# Patient Record
Sex: Female | Born: 1945 | Race: White | Hispanic: No | Smoking: Never smoker
Health system: Southern US, Community
[De-identification: ages and names within clinical notes are randomized; demographics above are authoritative.]

## PROBLEM LIST (undated history)

## (undated) ENCOUNTER — Ambulatory Visit (HOSPITAL_COMMUNITY): Admission: EM | Payer: Medicare Other

## (undated) DIAGNOSIS — I639 Cerebral infarction, unspecified: Secondary | ICD-10-CM

## (undated) DIAGNOSIS — R413 Other amnesia: Secondary | ICD-10-CM

## (undated) DIAGNOSIS — E039 Hypothyroidism, unspecified: Secondary | ICD-10-CM

## (undated) DIAGNOSIS — F32A Depression, unspecified: Secondary | ICD-10-CM

## (undated) DIAGNOSIS — S5290XA Unspecified fracture of unspecified forearm, initial encounter for closed fracture: Secondary | ICD-10-CM

## (undated) DIAGNOSIS — I6930 Unspecified sequelae of cerebral infarction: Secondary | ICD-10-CM

## (undated) DIAGNOSIS — I669 Occlusion and stenosis of unspecified cerebral artery: Secondary | ICD-10-CM

## (undated) DIAGNOSIS — E119 Type 2 diabetes mellitus without complications: Secondary | ICD-10-CM

## (undated) DIAGNOSIS — R269 Unspecified abnormalities of gait and mobility: Secondary | ICD-10-CM

## (undated) DIAGNOSIS — E785 Hyperlipidemia, unspecified: Secondary | ICD-10-CM

## (undated) DIAGNOSIS — F329 Major depressive disorder, single episode, unspecified: Secondary | ICD-10-CM

## (undated) HISTORY — PX: WISDOM TOOTH EXTRACTION: SHX21

## (undated) HISTORY — DX: Cerebral infarction, unspecified: I63.9

## (undated) HISTORY — PX: TONSILLECTOMY: SUR1361

## (undated) HISTORY — PX: OTHER SURGICAL HISTORY: SHX169

## (undated) HISTORY — PX: TUBAL LIGATION: SHX77

## (undated) HISTORY — PX: BREAST EXCISIONAL BIOPSY: SUR124

---

## 1998-08-27 ENCOUNTER — Other Ambulatory Visit: Admission: RE | Admit: 1998-08-27 | Discharge: 1998-08-27 | Payer: Self-pay | Admitting: Gynecology

## 1998-11-06 HISTORY — PX: OTHER SURGICAL HISTORY: SHX169

## 1999-03-07 HISTORY — PX: SUBDURAL HEMATOMA EVACUATION VIA CRANIOTOMY: SUR319

## 1999-03-29 ENCOUNTER — Inpatient Hospital Stay (HOSPITAL_COMMUNITY)
Admission: RE | Admit: 1999-03-29 | Discharge: 1999-04-06 | Payer: Self-pay | Admitting: Physical Medicine and Rehabilitation

## 1999-04-03 ENCOUNTER — Encounter: Payer: Self-pay | Admitting: Physical Medicine and Rehabilitation

## 1999-04-06 ENCOUNTER — Inpatient Hospital Stay (HOSPITAL_COMMUNITY): Admission: AD | Admit: 1999-04-06 | Discharge: 1999-04-18 | Payer: Self-pay | Admitting: *Deleted

## 1999-04-11 ENCOUNTER — Encounter: Payer: Self-pay | Admitting: *Deleted

## 2000-01-09 ENCOUNTER — Encounter: Payer: Self-pay | Admitting: Gynecology

## 2000-01-09 ENCOUNTER — Encounter: Admission: RE | Admit: 2000-01-09 | Discharge: 2000-01-09 | Payer: Self-pay | Admitting: Gynecology

## 2001-02-07 ENCOUNTER — Encounter: Admission: RE | Admit: 2001-02-07 | Discharge: 2001-05-08 | Payer: Self-pay | Admitting: Neurology

## 2001-07-01 ENCOUNTER — Encounter: Admission: RE | Admit: 2001-07-01 | Discharge: 2001-07-01 | Payer: Self-pay | Admitting: Internal Medicine

## 2001-07-01 ENCOUNTER — Other Ambulatory Visit: Admission: RE | Admit: 2001-07-01 | Discharge: 2001-07-01 | Payer: Self-pay | Admitting: Internal Medicine

## 2001-07-01 ENCOUNTER — Encounter: Payer: Self-pay | Admitting: Internal Medicine

## 2002-02-06 ENCOUNTER — Emergency Department (HOSPITAL_COMMUNITY): Admission: EM | Admit: 2002-02-06 | Discharge: 2002-02-06 | Payer: Self-pay | Admitting: Emergency Medicine

## 2003-03-02 ENCOUNTER — Other Ambulatory Visit: Admission: RE | Admit: 2003-03-02 | Discharge: 2003-03-02 | Payer: Self-pay | Admitting: Family Medicine

## 2003-03-12 ENCOUNTER — Encounter: Admission: RE | Admit: 2003-03-12 | Discharge: 2003-03-12 | Payer: Self-pay | Admitting: Internal Medicine

## 2003-03-12 ENCOUNTER — Encounter: Payer: Self-pay | Admitting: Internal Medicine

## 2003-03-19 ENCOUNTER — Encounter: Payer: Self-pay | Admitting: Internal Medicine

## 2003-03-19 ENCOUNTER — Encounter: Admission: RE | Admit: 2003-03-19 | Discharge: 2003-03-19 | Payer: Self-pay | Admitting: Internal Medicine

## 2004-03-25 ENCOUNTER — Encounter: Admission: RE | Admit: 2004-03-25 | Discharge: 2004-03-25 | Payer: Self-pay | Admitting: Internal Medicine

## 2004-06-27 ENCOUNTER — Encounter: Admission: RE | Admit: 2004-06-27 | Discharge: 2004-06-27 | Payer: Self-pay | Admitting: Family Medicine

## 2004-11-01 ENCOUNTER — Emergency Department (HOSPITAL_COMMUNITY): Admission: EM | Admit: 2004-11-01 | Discharge: 2004-11-01 | Payer: Self-pay | Admitting: Emergency Medicine

## 2004-11-08 ENCOUNTER — Ambulatory Visit: Payer: Self-pay | Admitting: Internal Medicine

## 2005-01-10 ENCOUNTER — Other Ambulatory Visit: Admission: RE | Admit: 2005-01-10 | Discharge: 2005-01-10 | Payer: Self-pay | Admitting: Internal Medicine

## 2005-01-10 ENCOUNTER — Ambulatory Visit: Payer: Self-pay | Admitting: Internal Medicine

## 2005-02-10 ENCOUNTER — Ambulatory Visit: Payer: Self-pay | Admitting: Internal Medicine

## 2005-03-09 ENCOUNTER — Ambulatory Visit: Payer: Self-pay | Admitting: Internal Medicine

## 2005-03-27 ENCOUNTER — Encounter: Admission: RE | Admit: 2005-03-27 | Discharge: 2005-03-27 | Payer: Self-pay | Admitting: Internal Medicine

## 2005-03-29 ENCOUNTER — Ambulatory Visit: Payer: Self-pay | Admitting: Internal Medicine

## 2005-05-15 ENCOUNTER — Ambulatory Visit: Payer: Self-pay | Admitting: Internal Medicine

## 2005-09-15 ENCOUNTER — Ambulatory Visit: Payer: Self-pay | Admitting: Internal Medicine

## 2006-03-04 ENCOUNTER — Emergency Department (HOSPITAL_COMMUNITY): Admission: EM | Admit: 2006-03-04 | Discharge: 2006-03-04 | Payer: Self-pay | Admitting: Emergency Medicine

## 2006-03-16 ENCOUNTER — Ambulatory Visit: Payer: Self-pay | Admitting: Internal Medicine

## 2006-06-13 ENCOUNTER — Encounter: Admission: RE | Admit: 2006-06-13 | Discharge: 2006-06-13 | Payer: Self-pay | Admitting: Internal Medicine

## 2006-07-02 ENCOUNTER — Ambulatory Visit: Payer: Self-pay | Admitting: Internal Medicine

## 2006-12-26 ENCOUNTER — Ambulatory Visit: Payer: Self-pay | Admitting: Internal Medicine

## 2006-12-26 LAB — CONVERTED CEMR LAB
ALT: 23 units/L (ref 0–40)
AST: 25 units/L (ref 0–37)
Calcium: 9.6 mg/dL (ref 8.4–10.5)
Chloride: 105 meq/L (ref 96–112)
Cholesterol: 166 mg/dL (ref 0–200)
Creatinine, Ser: 0.7 mg/dL (ref 0.4–1.2)
GFR calc non Af Amer: 91 mL/min
HDL: 45 mg/dL (ref >39.0)
Sodium: 141 meq/L (ref 135–145)
TSH: 1.73 microintl units/mL (ref 0.35–5.50)
Triglycerides: 182 mg/dL — ABNORMAL HIGH (ref 0–149)
VLDL: 36 mg/dL (ref 0–40)

## 2007-03-21 DIAGNOSIS — E039 Hypothyroidism, unspecified: Secondary | ICD-10-CM | POA: Insufficient documentation

## 2007-03-21 DIAGNOSIS — Z9889 Other specified postprocedural states: Secondary | ICD-10-CM

## 2007-03-21 DIAGNOSIS — I6350 Cerebral infarction due to unspecified occlusion or stenosis of unspecified cerebral artery: Secondary | ICD-10-CM | POA: Insufficient documentation

## 2007-07-02 ENCOUNTER — Ambulatory Visit: Payer: Self-pay | Admitting: Internal Medicine

## 2007-07-02 DIAGNOSIS — E785 Hyperlipidemia, unspecified: Secondary | ICD-10-CM

## 2007-07-02 DIAGNOSIS — F329 Major depressive disorder, single episode, unspecified: Secondary | ICD-10-CM

## 2007-07-05 LAB — CONVERTED CEMR LAB
ALT: 32 units/L (ref 0–35)
HDL: 45.6 mg/dL (ref >39.0)
LDL Cholesterol: 94 mg/dL (ref 0–99)
TSH: 1.67 microintl units/mL (ref 0.35–5.50)
Triglycerides: 162 mg/dL — ABNORMAL HIGH (ref 0–149)
VLDL: 32 mg/dL (ref 0–40)

## 2007-07-22 ENCOUNTER — Telehealth (INDEPENDENT_AMBULATORY_CARE_PROVIDER_SITE_OTHER): Payer: Self-pay | Admitting: *Deleted

## 2007-07-22 ENCOUNTER — Encounter: Payer: Self-pay | Admitting: Family Medicine

## 2007-07-23 ENCOUNTER — Ambulatory Visit: Payer: Self-pay | Admitting: Internal Medicine

## 2007-07-23 DIAGNOSIS — L03211 Cellulitis of face: Secondary | ICD-10-CM | POA: Insufficient documentation

## 2007-07-23 DIAGNOSIS — L0201 Cutaneous abscess of face: Secondary | ICD-10-CM

## 2007-08-07 ENCOUNTER — Encounter: Admission: RE | Admit: 2007-08-07 | Discharge: 2007-08-07 | Payer: Self-pay | Admitting: Internal Medicine

## 2007-08-15 ENCOUNTER — Ambulatory Visit: Payer: Self-pay | Admitting: Internal Medicine

## 2007-08-30 ENCOUNTER — Telehealth (INDEPENDENT_AMBULATORY_CARE_PROVIDER_SITE_OTHER): Payer: Self-pay | Admitting: *Deleted

## 2007-09-05 ENCOUNTER — Ambulatory Visit: Payer: Self-pay | Admitting: Internal Medicine

## 2007-09-05 DIAGNOSIS — R21 Rash and other nonspecific skin eruption: Secondary | ICD-10-CM

## 2007-09-05 LAB — CONVERTED CEMR LAB: LDL Goal: 100 mg/dL

## 2007-11-15 ENCOUNTER — Telehealth (INDEPENDENT_AMBULATORY_CARE_PROVIDER_SITE_OTHER): Payer: Self-pay | Admitting: *Deleted

## 2008-01-06 ENCOUNTER — Telehealth (INDEPENDENT_AMBULATORY_CARE_PROVIDER_SITE_OTHER): Payer: Self-pay | Admitting: *Deleted

## 2008-01-13 ENCOUNTER — Encounter: Payer: Self-pay | Admitting: Internal Medicine

## 2008-01-24 ENCOUNTER — Encounter: Payer: Self-pay | Admitting: Internal Medicine

## 2008-04-03 ENCOUNTER — Encounter: Payer: Self-pay | Admitting: Internal Medicine

## 2008-04-26 ENCOUNTER — Emergency Department (HOSPITAL_COMMUNITY): Admission: EM | Admit: 2008-04-26 | Discharge: 2008-04-26 | Payer: Self-pay | Admitting: Emergency Medicine

## 2008-04-26 ENCOUNTER — Encounter: Payer: Self-pay | Admitting: Internal Medicine

## 2008-08-27 ENCOUNTER — Encounter: Admission: RE | Admit: 2008-08-27 | Discharge: 2008-08-27 | Payer: Self-pay | Admitting: *Deleted

## 2008-09-08 ENCOUNTER — Encounter: Admission: RE | Admit: 2008-09-08 | Discharge: 2008-09-08 | Payer: Self-pay | Admitting: *Deleted

## 2009-02-12 ENCOUNTER — Encounter: Admission: RE | Admit: 2009-02-12 | Discharge: 2009-02-12 | Payer: Self-pay | Admitting: Internal Medicine

## 2009-12-03 ENCOUNTER — Encounter: Admission: RE | Admit: 2009-12-03 | Discharge: 2009-12-03 | Payer: Self-pay | Admitting: Geriatric Medicine

## 2011-03-20 ENCOUNTER — Other Ambulatory Visit: Payer: Self-pay | Admitting: Geriatric Medicine

## 2011-03-20 DIAGNOSIS — Z1231 Encounter for screening mammogram for malignant neoplasm of breast: Secondary | ICD-10-CM

## 2011-03-29 ENCOUNTER — Ambulatory Visit
Admission: RE | Admit: 2011-03-29 | Discharge: 2011-03-29 | Disposition: A | Discharge status: Home / Self Care | Payer: Medicare Other | Source: Ambulatory Visit | Attending: Geriatric Medicine | Admitting: Geriatric Medicine

## 2011-03-29 DIAGNOSIS — Z1231 Encounter for screening mammogram for malignant neoplasm of breast: Secondary | ICD-10-CM

## 2011-07-08 ENCOUNTER — Emergency Department (HOSPITAL_COMMUNITY)
Admission: EM | Admit: 2011-07-08 | Discharge: 2011-07-08 | Disposition: A | Discharge status: Home / Self Care | Payer: Medicare Other | Attending: Emergency Medicine | Admitting: Emergency Medicine

## 2011-07-08 ENCOUNTER — Emergency Department (HOSPITAL_COMMUNITY): Payer: Medicare Other

## 2011-07-08 DIAGNOSIS — Z8679 Personal history of other diseases of the circulatory system: Secondary | ICD-10-CM | POA: Insufficient documentation

## 2011-07-08 DIAGNOSIS — S0990XA Unspecified injury of head, initial encounter: Secondary | ICD-10-CM | POA: Insufficient documentation

## 2011-07-08 DIAGNOSIS — E039 Hypothyroidism, unspecified: Secondary | ICD-10-CM | POA: Insufficient documentation

## 2011-07-08 DIAGNOSIS — Y92009 Unspecified place in unspecified non-institutional (private) residence as the place of occurrence of the external cause: Secondary | ICD-10-CM | POA: Insufficient documentation

## 2011-07-08 DIAGNOSIS — S1093XA Contusion of unspecified part of neck, initial encounter: Secondary | ICD-10-CM | POA: Insufficient documentation

## 2011-07-08 DIAGNOSIS — G939 Disorder of brain, unspecified: Secondary | ICD-10-CM | POA: Insufficient documentation

## 2011-07-08 DIAGNOSIS — S0003XA Contusion of scalp, initial encounter: Secondary | ICD-10-CM | POA: Insufficient documentation

## 2011-07-08 DIAGNOSIS — IMO0002 Reserved for concepts with insufficient information to code with codable children: Secondary | ICD-10-CM | POA: Insufficient documentation

## 2011-07-08 DIAGNOSIS — W19XXXA Unspecified fall, initial encounter: Secondary | ICD-10-CM | POA: Insufficient documentation

## 2011-07-08 DIAGNOSIS — R51 Headache: Secondary | ICD-10-CM | POA: Insufficient documentation

## 2011-08-03 LAB — CULTURE, ROUTINE-ABSCESS

## 2011-10-02 ENCOUNTER — Emergency Department (HOSPITAL_COMMUNITY): Payer: Medicare Other

## 2011-10-02 ENCOUNTER — Encounter: Payer: Self-pay | Admitting: Emergency Medicine

## 2011-10-02 ENCOUNTER — Emergency Department (HOSPITAL_COMMUNITY)
Admission: EM | Admit: 2011-10-02 | Discharge: 2011-10-03 | Disposition: A | Discharge status: Home / Self Care | Payer: Medicare Other | Attending: Emergency Medicine | Admitting: Emergency Medicine

## 2011-10-02 DIAGNOSIS — F068 Other specified mental disorders due to known physiological condition: Secondary | ICD-10-CM | POA: Insufficient documentation

## 2011-10-02 DIAGNOSIS — Z79899 Other long term (current) drug therapy: Secondary | ICD-10-CM | POA: Insufficient documentation

## 2011-10-02 DIAGNOSIS — Z8673 Personal history of transient ischemic attack (TIA), and cerebral infarction without residual deficits: Secondary | ICD-10-CM | POA: Insufficient documentation

## 2011-10-02 DIAGNOSIS — E7800 Pure hypercholesterolemia, unspecified: Secondary | ICD-10-CM | POA: Insufficient documentation

## 2011-10-02 DIAGNOSIS — K8050 Calculus of bile duct without cholangitis or cholecystitis without obstruction: Secondary | ICD-10-CM

## 2011-10-02 DIAGNOSIS — R1310 Dysphagia, unspecified: Secondary | ICD-10-CM | POA: Insufficient documentation

## 2011-10-02 DIAGNOSIS — K8020 Calculus of gallbladder without cholecystitis without obstruction: Secondary | ICD-10-CM | POA: Insufficient documentation

## 2011-10-02 DIAGNOSIS — R1013 Epigastric pain: Secondary | ICD-10-CM | POA: Insufficient documentation

## 2011-10-02 DIAGNOSIS — E119 Type 2 diabetes mellitus without complications: Secondary | ICD-10-CM | POA: Insufficient documentation

## 2011-10-02 LAB — COMPREHENSIVE METABOLIC PANEL
ALT: 125 U/L — ABNORMAL HIGH (ref 0–35)
CO2: 25 mEq/L (ref 19–32)
Calcium: 9.6 mg/dL (ref 8.4–10.5)
Chloride: 104 mEq/L (ref 96–112)
Creatinine, Ser: 0.81 mg/dL (ref 0.50–1.10)
GFR calc non Af Amer: 75 mL/min — ABNORMAL LOW (ref >90)
Glucose, Bld: 155 mg/dL — ABNORMAL HIGH (ref 70–99)
Sodium: 141 mEq/L (ref 135–145)
Total Bilirubin: 0.8 mg/dL (ref 0.3–1.2)

## 2011-10-02 LAB — DIFFERENTIAL
Basophils Absolute: 0 10*3/uL (ref 0.0–0.1)
Basophils Relative: 0 % (ref 0–1)
Lymphocytes Relative: 9 % — ABNORMAL LOW (ref 12–46)
Lymphs Abs: 1.1 10*3/uL (ref 0.7–4.0)
Neutro Abs: 10.6 10*3/uL — ABNORMAL HIGH (ref 1.7–7.7)
Neutrophils Relative %: 84 % — ABNORMAL HIGH (ref 43–77)

## 2011-10-02 LAB — POCT I-STAT TROPONIN I

## 2011-10-02 LAB — CBC
MCH: 30.7 pg (ref 26.0–34.0)
MCV: 89.7 fL (ref 78.0–100.0)
RDW: 13.3 % (ref 11.5–15.5)

## 2011-10-02 LAB — TROPONIN I: Troponin I: <0.3 ng/mL (ref <0.30)

## 2011-10-02 MED ORDER — MORPHINE SULFATE 4 MG/ML IJ SOLN: 4.0000 mg | Freq: Once | INTRAMUSCULAR | Status: DC

## 2011-10-02 MED ORDER — HYDROCODONE-ACETAMINOPHEN 5-325 MG PO TABS: 1.0000 | ORAL_TABLET | ORAL | Status: AC | PRN

## 2011-10-02 MED ORDER — CIPROFLOXACIN HCL 500 MG PO TABS: 500.0000 mg | ORAL_TABLET | Freq: Two times a day (BID) | ORAL | Status: AC

## 2011-10-02 NOTE — ED Notes (Signed)
INT attempted x 4. Unsuccessful.

## 2011-10-02 NOTE — ED Provider Notes (Signed)
Spoke with CCS Lindie Spruce).  Recommends cipro, oral analgesic, follow-up in office in the next 48 hours.  Jimmye Norman, NP 10/02/11 [phone]

## 2011-10-02 NOTE — ED Notes (Signed)
Report received, 1st contact with pt and will assume care of pt at this time. 

## 2011-10-02 NOTE — ED Provider Notes (Signed)
This 65 year old female with dementia has a vague upper abdominal pain and mild tenderness to the epigastrium and right upper quadrant over the last few hours prior to arrival.  [place] ordered then results d/w CDU PA to d/w CCS for f/u.  Hurman Horn, MD 10/03/11 [phone]

## 2011-10-02 NOTE — ED Provider Notes (Signed)
History     CSN: [id number] Arrival date & time: 10/02/2011  4:05 PM   First MD Initiated Contact with Patient 10/02/11 1615      Chief Complaint  Patient presents with  . Abdominal Pain    (Consider location/radiation/quality/duration/timing/severity/associated sxs/prior treatment) The history is provided by the patient, the nursing home and a relative.  Pt with h/o CVA and subsequent dementia (issues with short term memory) here with abdominal pain.  It started a few hours PTA.  Started in epigastrium area while she was sitting down and relaxing.  Briefly radiated to chest.  Constant for a couple hours but improving since arrival here.  Per nursing home, mild associated diaphoresis, but no dyspnea, n/v/d.  No recent fevers.  No syncope.  No recent illness or complaint.  Overall severity moderate.  Not changed with position or exertion.  Past Medical History  Diagnosis Date  . Thyroid disease   . Diabetes mellitus   . High cholesterol     History reviewed. No pertinent past surgical history.  No family history on file.  History  Substance Use Topics  . Smoking status: Never Smoker   . Smokeless tobacco: Not on file  . Alcohol Use: No    OB History    Grav Para Term Preterm Abortions TAB SAB Ect Mult Living                  Review of Systems  Constitutional: Negative for fever and chills.  HENT: Negative for facial swelling.   Eyes: Negative for visual disturbance.  Respiratory: Negative for cough, chest tightness, shortness of breath and wheezing.   Gastrointestinal: Negative for vomiting and diarrhea.  Genitourinary: Negative for dysuria, frequency and difficulty urinating.  Skin: Negative for rash.  Neurological: Negative for weakness and numbness.  Psychiatric/Behavioral: Negative for behavioral problems and confusion.  All other systems reviewed and are negative.    Allergies  Review of patient's allergies indicates no known allergies.  Home Medications    Current Outpatient Rx  Name Route Sig Dispense Refill  . BUPROPION HCL ER (SR) 150 MG PO TB12 Oral Take 150 mg by mouth 2 (two) times daily.      . CHOLECALCIFEROL 1000 UNITS PO TABS Oral Take 1,000 Units by mouth daily.      Marland Kitchen CLOPIDOGREL BISULFATE 75 MG PO TABS Oral Take 75 mg by mouth daily.      Marland Kitchen LEVOTHYROXINE SODIUM 125 MCG PO TABS Oral Take 125 mcg by mouth daily.      Marland Kitchen LORAZEPAM 0.5 MG PO TABS Oral Take 0.5 mg by mouth every morning.      Marland Kitchen LORAZEPAM 1 MG PO TABS Oral Take 1 mg by mouth 2 (two) times daily. At noon and bedtime     . MINERAL OIL LIGHT OIL Both Ears Place 2 drops into both ears once a week. On Wednesday     . THERA M PLUS PO TABS Oral Take 1 tablet by mouth daily.      Marland Kitchen PRAVASTATIN SODIUM 20 MG PO TABS Oral Take 60 mg by mouth every evening.      Marland Kitchen SERTRALINE HCL 50 MG PO TABS Oral Take 50 mg by mouth every evening.      Marland Kitchen SITAGLIPTIN PHOSPHATE 100 MG PO TABS Oral Take 100 mg by mouth every morning.      . TRIAMTERENE-HCTZ 37.5-25 MG PO TABS Oral Take 0.5 tablets by mouth every morning.      Marland Kitchen CIPROFLOXACIN HCL 500  MG PO TABS Oral Take 1 tablet (500 mg total) by mouth every 12 (twelve) hours. 14 tablet 0  . HYDROCODONE-ACETAMINOPHEN 5-325 MG PO TABS Oral Take 1 tablet by mouth every 4 (four) hours as needed for pain. 10 tablet 0    BP 117/64  Pulse 79  Temp 98.7 F (37.1 C)  Resp 18  SpO2 98%  Physical Exam  Nursing note and vitals reviewed. Constitutional: She is oriented to person, place, and time. She appears well-developed and well-nourished. No distress.  HENT:  Head: Normocephalic.  Nose: Nose normal.  Eyes: EOM are normal.  Neck: Normal range of motion. Neck supple.  Cardiovascular: Normal rate, regular rhythm and intact distal pulses.   Pulmonary/Chest: Effort normal and breath sounds normal. No respiratory distress. She has no wheezes. She has no rales. She exhibits no tenderness.  Abdominal: Soft. She exhibits no distension. There is  tenderness (mild epigastric w/o peritonitis; no murphy's sign).  Musculoskeletal: Normal range of motion. She exhibits no edema and no tenderness.  Neurological: She is alert and oriented to person, place, and time.       Normal strength  Skin: Skin is warm and dry. No rash noted. She is not diaphoretic.  Psychiatric: She has a normal mood and affect.    ED Course  Procedures (including critical care time)  Labs Reviewed  LIPASE, BLOOD - Abnormal; Notable for the following:    Lipase 65 (*)    All other components within normal limits  COMPREHENSIVE METABOLIC PANEL - Abnormal; Notable for the following:    Potassium 3.0 (*)    Glucose, Bld 155 (*)    AST 265 (*)    ALT 125 (*)    Alkaline Phosphatase 158 (*)    GFR calc non Af Amer 75 (*)    GFR calc Af Amer 86 (*)    All other components within normal limits  CBC - Abnormal; Notable for the following:    WBC 12.6 (*)    All other components within normal limits  DIFFERENTIAL - Abnormal; Notable for the following:    Neutrophils Relative 84 (*)    Neutro Abs 10.6 (*)    Lymphocytes Relative 9 (*)    All other components within normal limits  TROPONIN I  POCT I-STAT TROPONIN I  TROPONIN I  I-STAT TROPONIN I   Dg Chest 2 View  10/02/2011  *RADIOLOGY REPORT*  Clinical Data:  Chest pain.  CHEST - 2 VIEW  Comparison: None  Findings: The heart size and mediastinal contours are within normal limits.  Both lungs are clear.  No edema, infiltrate or nodule identified.  No evidence of pleural fluid.  The bony thorax shows mild degenerative changes of the spine.  IMPRESSION: No active disease.  Original Report Authenticated By: Reola Calkins, M.D.   US Abdomen Complete  10/02/2011  *RADIOLOGY REPORT*  Clinical Data:  Right upper quadrant pain. Dementia, hypertension.  COMPLETE ABDOMINAL ULTRASOUND  Comparison:  None  Findings:  Gallbladder:  Within the gallbladder, layering stones, sludge are identified.  Numerous stones are too  numerous to count and measure less than 5 mm in diameter.  There is significant shadowing from the stones.  Gallbladder wall is 1.9 mm in thickness.  No sonographic Murphy's sign.  Stones are mobile when the patient is repositioned.  Common bile duct:  Common bile duct is 5.7 mm.  Liver:  The liver has a normal appearance.  IVC:  The inferior vena cava has a normal  appearance.  Pancreas:  There is limited visualization of the pancreas because of overlying bowel gas.  Spleen:  The spleen has a normal appearance and measures 10.4 cm.  Right Kidney:  The right kidney has a normal appearance and measures 10.4 cm.  Left Kidney:  The left kidney has a normal appearance and measures 10.8 cm.  Abdominal aorta:  The abdominal aorta is not aneurysmal.  Maximum diameter 2.1 cm.  IMPRESSION:  1.  Numerous stones within the gallbladder. 2.  Gallbladder wall is normal in thickness.  No sonographic Murphy's sign. 3.  Normal-appearing kidneys.  Original Report Authenticated By: Patterson Hammersmith, M.D.     1. Cholecystolithiasis   2. Biliary colic     Date: 10/02/2011  Rate: 79  Rhythm: normal sinus rhythm  QRS Axis: normal  Intervals: normal  ST/T Wave abnormalities: nonspecific T wave changes  Conduction Disutrbances:none  Narrative Interpretation:   Old EKG Reviewed: unchanged     MDM  Pt with episode of upper abdominal pain and associated diaphoresis.  Since poor historian, EKG and troponin done (unremarkable).  No peritonitis on exam.  Labs raise concern for GB etiology.  Transferred to CDU with [place] pending.  Pt hemodynamically stable and sxs improved with ED intervention.        Milus Glazier 10/02/11 2347

## 2011-10-02 NOTE — ED Notes (Signed)
Sister wants to be called if pt discharged. [phone]. States she will come pick pt up and take her back to nursing home.

## 2011-10-02 NOTE — ED Notes (Signed)
Patient transported to Ultrasound 

## 2011-10-02 NOTE — ED Notes (Signed)
Returned from ultrasound.

## 2011-10-02 NOTE — ED Notes (Signed)
Complaining of acute onset abd pain starting 1 hour ago. States pain is all over abdomen. Denies n/v/d.

## 2011-10-02 NOTE — ED Notes (Signed)
New and old ekg handed to Dr.Bednar at [facility]

## 2011-10-02 NOTE — ED Notes (Signed)
Pt resting in bed. Resp even and unlabored. Denies pain. Family at bedside. NAD noted.

## 2011-10-03 NOTE — ED Provider Notes (Signed)
Medical screening examination/treatment/procedure(s) were conducted as a shared visit with non-physician practitioner(s) and myself.  I personally evaluated the patient during the encounter  Hurman Horn, MD 10/03/11 1441

## 2011-10-03 NOTE — ED Provider Notes (Signed)
I saw and evaluated the patient, reviewed the resident's note and Pt's ECG and I agree with the findings and plan.  Hurman Horn, MD 10/03/11 [phone]

## 2011-10-03 NOTE — ED Notes (Signed)
Pt discharged with sister for transportation back to facility. Pt denies any pain or questions upon discharge.

## 2011-10-10 ENCOUNTER — Encounter: Payer: Self-pay | Admitting: Internal Medicine

## 2011-10-17 ENCOUNTER — Encounter (INDEPENDENT_AMBULATORY_CARE_PROVIDER_SITE_OTHER): Payer: Self-pay | Admitting: Surgery

## 2011-10-17 ENCOUNTER — Ambulatory Visit (INDEPENDENT_AMBULATORY_CARE_PROVIDER_SITE_OTHER): Payer: Medicare Other | Admitting: Surgery

## 2011-10-17 VITALS — BP 126/88 | HR 60 | Temp 97.4°F | Resp 16 | Ht 62.0 in | Wt 179.0 lb

## 2011-10-17 DIAGNOSIS — K8010 Calculus of gallbladder with chronic cholecystitis without obstruction: Secondary | ICD-10-CM | POA: Insufficient documentation

## 2011-10-17 NOTE — Progress Notes (Signed)
Chief Complaint  Patient presents with  . New Evaluation    cholelithiasis - referral by Dr. John Bednar and Dr. Martha Linker,     HISTORY: Patient is a 65-year-old white female referred from [place] emergency department for symptomatic cholelithiasis. The patient presented with right upper quadrant abdominal pain on October 02, 2011. She had laboratory studies which showed elevated liver function test. Lipase was slightly elevated at 65. Abdominal ultrasound showed multiple gallstones with sludge. Patient's pain resolved spontaneously. She has had no further discomfort. Patient presents today for evaluation for possible cholecystectomy.  No family history of hepatobiliary disease. Patient denies jaundice or acholic stools. Her only previous abdominal surgery was a tubal ligation. Patient is a type II diabetic.   Past Medical History  Diagnosis Date  . Thyroid disease   . Diabetes mellitus   . High cholesterol   . Stroke 2000     Current Outpatient Prescriptions  Medication Sig Dispense Refill  . buPROPion (WELLBUTRIN SR) 150 MG 12 hr tablet Take 150 mg by mouth 2 (two) times daily.        . Cholecalciferol 1000 UNITS tablet Take 1,000 Units by mouth daily.        . clopidogrel (PLAVIX) 75 MG tablet Take 75 mg by mouth daily.        . levothyroxine (SYNTHROID, LEVOTHROID) 125 MCG tablet Take 125 mcg by mouth daily.        . LORazepam (ATIVAN) 0.5 MG tablet Take 0.5 mg by mouth every morning.        . LORazepam (ATIVAN) 1 MG tablet Take 1 mg by mouth 2 (two) times daily. At noon and bedtime       . mineral oil external liquid Place 2 drops into both ears once a week. On Wednesday       . Multiple Vitamins-Minerals (MULTIVITAMINS THER. W/MINERALS) TABS Take 1 tablet by mouth daily.        . pravastatin (PRAVACHOL) 20 MG tablet Take 60 mg by mouth every evening.        . sertraline (ZOLOFT) 50 MG tablet Take 50 mg by mouth every evening.        . sitaGLIPtin (JANUVIA) 100 MG  tablet Take 100 mg by mouth every morning.        . triamterene-hydrochlorothiazide (MAXZIDE-25) 37.5-25 MG per tablet Take 0.5 tablets by mouth every morning.           No Known Allergies   History reviewed. No pertinent family history.   History   Social History  . Marital Status: Widowed    Spouse Name: N/A    Number of Children: N/A  . Years of Education: N/A   Social History Main Topics  . Smoking status: Never Smoker   . Smokeless tobacco: Never Used  . Alcohol Use: No  . Drug Use: No  . Sexually Active:    Other Topics Concern  . None   Social History Narrative  . None     REVIEW OF SYSTEMS - PERTINENT POSITIVES ONLY: Single episode right upper quadrant abdominal pain. Denies jaundice. Denies acholic stools. Denies hepatitis. Denies pancreatitis.   EXAM: Filed Vitals:   10/17/11 1316  BP: 126/88  Pulse: 60  Temp: 97.4 F (36.3 C)  Resp: 16    HEENT: normocephalic; pupils equal and reactive; sclerae clear; dentition good; mucous membranes moist NECK:  No nodules; symmetric on extension; no palpable anterior or posterior cervical lymphadenopathy; no supraclavicular masses; no tenderness CHEST:   clear to auscultation bilaterally without rales, rhonchi, or wheezes CARDIAC: regular rate and rhythm without significant murmur; peripheral pulses are full ABDOMEN: Soft without distention. Well-healed small surgical wounds suprapubic. No sign of hernia. No hepatosplenomegaly. No masses. No tenderness. EXT:  non-tender without edema; no deformity NEURO: no gross focal deficits; no sign of tremor; short-term memory problems are obvious   LABORATORY RESULTS: See E-Chart for most recent results   RADIOLOGY RESULTS: See E-Chart or I-Site for most recent results   IMPRESSION: #1 symptomatic cholelithiasis #2 rule out choledocholithiasis #3 history of cerebrovascular accident on Plavix #4 type 2 diabetes   PLAN: I discussed the indications for  cholecystectomy with the patient and with her 2 sisters who accompany her today. I have recommended that we repeat her liver function test and her lipase levels today. If these remain elevated then I believe she will need consultation with gastroenterology and possible ERCP. If liver function tests have returned to normal levels, then I think she should proceed electively with cholecystectomy in the near future.  We have discussed laparoscopic cholecystectomy. We have discussed the risk and benefits of the procedure. I have provided her with written literature to review. This can be done in the near future at a time convenient for the patient.  The risks and benefits of the procedure have been discussed at length with the patient.  The patient understands the proposed procedure, potential alternative treatments, and the course of recovery to be expected.  All of the patient's questions have been answered at this time.  The patient wishes to proceed with surgery and will schedule a date for their procedure through our office staff.   Ramina Hulet M. Roxene Alviar, MD, FACS General & Endocrine Surgery [facility]    Visit Diagnoses: 1. Cholelithiasis with cholecystitis     Primary Care Physician: TRIPP,HENRY, MD  ER:  Dr. John Bednar and Dr. Martha Linker 

## 2011-10-17 NOTE — Patient Instructions (Addendum)
Go to lab today for liver function tests. Will call with results. tmg  Make appointment at [facility] for eval of Bartholin's Gland cyst. tmg

## 2011-10-18 ENCOUNTER — Emergency Department (INDEPENDENT_AMBULATORY_CARE_PROVIDER_SITE_OTHER)
Admission: EM | Admit: 2011-10-18 | Discharge: 2011-10-18 | Disposition: A | Discharge status: Home / Self Care | Payer: Medicare Other | Source: Home / Self Care | Attending: Emergency Medicine | Admitting: Emergency Medicine

## 2011-10-18 ENCOUNTER — Encounter (HOSPITAL_COMMUNITY): Payer: Self-pay | Admitting: Emergency Medicine

## 2011-10-18 DIAGNOSIS — L0390 Cellulitis, unspecified: Secondary | ICD-10-CM

## 2011-10-18 DIAGNOSIS — L0291 Cutaneous abscess, unspecified: Secondary | ICD-10-CM

## 2011-10-18 DIAGNOSIS — N764 Abscess of vulva: Secondary | ICD-10-CM

## 2011-10-18 LAB — COMPREHENSIVE METABOLIC PANEL
ALT: 15 U/L (ref 0–35)
AST: 17 U/L (ref 0–37)
Albumin: 4.3 g/dL (ref 3.5–5.2)
CO2: 26 mEq/L (ref 19–32)
Calcium: 9.8 mg/dL (ref 8.4–10.5)
Chloride: 105 mEq/L (ref 96–112)
Creat: 0.89 mg/dL (ref 0.50–1.10)
Potassium: 4 mEq/L (ref 3.5–5.3)
Sodium: 141 mEq/L (ref 135–145)
Total Protein: 6.7 g/dL (ref 6.0–8.3)

## 2011-10-18 LAB — LIPASE: Lipase: 46 U/L (ref 0–75)

## 2011-10-18 MED ORDER — DOXYCYCLINE HYCLATE 100 MG PO CAPS: 100.0000 mg | ORAL_CAPSULE | Freq: Two times a day (BID) | ORAL | Status: DC

## 2011-10-18 MED ORDER — LIDOCAINE HCL (PF) 2 % IJ SOLN
5.0000 mL | Freq: Once | INTRAMUSCULAR | Status: AC
  Administered 2011-10-18: 5 mL

## 2011-10-18 MED ORDER — BACITRACIN 500 UNIT/GM EX OINT
1.0000 "application " | TOPICAL_OINTMENT | Freq: Once | CUTANEOUS | Status: AC
  Administered 2011-10-18: 1 via TOPICAL

## 2011-10-18 NOTE — Progress Notes (Signed)
Sister Kathie Rhodes aware- No GI appointment needed to surgery per Dr. Gerrit Friends

## 2011-10-18 NOTE — ED Notes (Signed)
Sister PREFERS MORNING VIEW FACILITY WGN:FAOZH,YQ BE CALLED WITH PENDING CX REPORT

## 2011-10-18 NOTE — Progress Notes (Signed)
Quick Note:  These results are acceptable for scheduled surgery. TMG ______ 

## 2011-10-18 NOTE — ED Notes (Signed)
Gauze in tact for minimal bleeding and maxi pad given

## 2011-10-18 NOTE — ED Provider Notes (Addendum)
History     CSN: [id number] Arrival date & time: 10/18/2011 10:55 AM   First MD Initiated Contact with Patient 10/18/11 1025      Chief Complaint  Patient presents with  . Recurrent Skin Infections    (Consider location/radiation/quality/duration/timing/severity/associated sxs/prior treatment) HPI Comments: Pt with painful erythematous mass of gradually increasing size in left labia x several days. Caregiver strates started off as a "pimple". Has blisters today and is draining serous fluid. No N/V, fevers, no prodromal sx. Has not tried anything for pain. No h/o recurrent skin infections. Caregiver also notes area erythema in left axilla starting today.   Patient is a 65 y.o. female presenting with abscess. The history is provided by a caregiver.  Abscess  This is a new problem. The abscess is present on the genitalia. The abscess is characterized by painfulness and draining. It is unknown what she was exposed to. Pertinent negatives include no fever and no vomiting.    Past Medical History  Diagnosis Date  . Thyroid disease   . Diabetes mellitus   . High cholesterol   . Stroke 2000  . Cholelithiasis     Past Surgical History  Procedure Date  . Tubal ligation   . Wisdom tooth extraction   . Breast surgery     breast biopsy   . Tonsillectomy   . Subdrual hematoma 2000    as a result of the stroke     History reviewed. No pertinent family history.  History  Substance Use Topics  . Smoking status: Never Smoker   . Smokeless tobacco: Never Used  . Alcohol Use: No    OB History    Grav Para Term Preterm Abortions TAB SAB Ect Mult Living                  Review of Systems  Constitutional: Negative for fever.  Gastrointestinal: Negative for nausea and vomiting.  Genitourinary: Negative.  Negative for genital sores.  Musculoskeletal: Negative for myalgias and back pain.  Skin: Negative for rash.  Neurological: Negative for weakness.    Allergies  Review of  patient's allergies indicates no known allergies.  Home Medications   Current Outpatient Rx  Name Route Sig Dispense Refill  . BUPROPION HCL ER (SR) 150 MG PO TB12 Oral Take 150 mg by mouth 2 (two) times daily.      . CHOLECALCIFEROL 1000 UNITS PO TABS Oral Take 1,000 Units by mouth daily.      Marland Kitchen LEVOTHYROXINE SODIUM 125 MCG PO TABS Oral Take 125 mcg by mouth daily.      Marland Kitchen LORAZEPAM 0.5 MG PO TABS Oral Take 0.5 mg by mouth every morning.      Marland Kitchen LORAZEPAM 1 MG PO TABS Oral Take 1 mg by mouth 2 (two) times daily. At noon and bedtime     . PRAVASTATIN SODIUM 20 MG PO TABS Oral Take 60 mg by mouth every evening.      . TRIAMTERENE-HCTZ 37.5-25 MG PO TABS Oral Take 0.5 tablets by mouth every morning.      Marland Kitchen CLOPIDOGREL BISULFATE 75 MG PO TABS Oral Take 75 mg by mouth daily.      Marland Kitchen DOXYCYCLINE HYCLATE 100 MG PO CAPS Oral Take 1 capsule (100 mg total) by mouth 2 (two) times daily. 20 capsule 0  . MINERAL OIL LIGHT OIL Both Ears Place 2 drops into both ears once a week. On Wednesday     . THERA M PLUS PO TABS Oral Take 1  tablet by mouth daily.      . SERTRALINE HCL 50 MG PO TABS Oral Take 50 mg by mouth every evening.      Marland Kitchen SITAGLIPTIN PHOSPHATE 100 MG PO TABS Oral Take 100 mg by mouth every morning.        BP 119/81  Pulse 67  Temp(Src) 98.8 F (37.1 C) (Oral)  Resp 18  SpO2 99%  Physical Exam  Nursing note and vitals reviewed. Constitutional: She is oriented to person, place, and time. She appears well-developed and well-nourished. No distress.  HENT:  Head: Normocephalic and atraumatic.  Eyes: EOM are normal. Pupils are equal, round, and reactive to light.  Neck: Normal range of motion.  Cardiovascular: Regular rhythm.   Pulmonary/Chest: Effort normal and breath sounds normal.  Abdominal: She exhibits no distension.  Genitourinary: There is rash, tenderness and lesion on the left labia.       2 x 3 cm are induration, erythema, tenderness with central fluctuance. Questionable  blisters. No surrounding inguinal LN.   Musculoskeletal: Normal range of motion.  Neurological: She is alert and oriented to person, place, and time.  Skin: Skin is warm and dry.       Small area erythema in left axilla c/w early skin infection. No induration, tenderness, drainage.   Psychiatric: She has a normal mood and affect. Her behavior is normal. Judgment and thought content normal.    ED Course  INCISION AND DRAINAGE Date/Time: 10/18/2011 11:30 AM Performed by: Luiz Blare Authorized by: Luiz Blare Consent: Verbal consent obtained. Written consent not obtained. Risks and benefits: risks, benefits and alternatives were discussed Consent given by: patient and guardian Patient understanding: patient states understanding of the procedure being performed Patient consent: the patient's understanding of the procedure matches consent given Procedure consent: procedure consent matches procedure scheduled Relevant documents: relevant documents present and verified Test results: test results not available Site marked: the operative site was not marked Imaging studies: imaging studies not available Patient identity confirmed: verbally with patient Time out: Immediately prior to procedure a "time out" was called to verify the correct patient, procedure, equipment, support staff and site/side marked as required. Type: abscess Location: left labia. Anesthesia: local infiltration Local anesthetic: lidocaine 2% without epinephrine Patient sedated: no Scalpel size: 11 Incision type: cruciate. Complexity: complex Drainage: purulent Drainage amount: moderate Wound treatment: wound left open Patient tolerance: Patient tolerated the procedure well with no immediate complications. Comments: Bunt dissection with sterile qtip to break up loculations. Viral and bacterial cx sent. Bacitracin and sterile dressing.    (including critical care time)   Labs Reviewed  HERPES  SIMPLEX VIRUS CULTURE  CULTURE, ROUTINE-ABSCESS   No results found.   1. Labial abscess   2. Cellulitis and abscess       MDM  2 x 3 cm abcess left labia starting 2-3 days ago.  ? Blister? sent off herpes cx. Starting on doxy as will cover furuncle she has in left axilla as well but sent off abscess cx. ewill have suzanne contact pmd if herpes (+) or needs change in abx. Pt to return here or f/u with pmd in 2 days for wound recheck. Caregiver verbalized understanding.   Luiz Blare, MD 10/18/11 1726  Luiz Blare, MD 10/18/11 1728

## 2011-10-18 NOTE — ED Notes (Signed)
Pt brought in by family member for left labia ulcer with min bleeding and bump near left breast.pt states bump to labia appeared x 3 dys ago but pt c/o throb pain and today she noticed the one by breast.denies hx herpes.pt is from [facility].

## 2011-10-20 LAB — CULTURE, ROUTINE-ABSCESS

## 2011-10-20 LAB — HERPES SIMPLEX VIRUS CULTURE: Culture: NOT DETECTED

## 2011-10-24 ENCOUNTER — Telehealth (HOSPITAL_COMMUNITY): Payer: Self-pay | Admitting: *Deleted

## 2011-10-24 NOTE — ED Notes (Signed)
Abscess culture: Abundant MRSA. No Herpes virus detected. Pt. adequately treated with Doxycycline. I called and left message for pt. to call. Vassie Moselle 10/24/2011

## 2011-10-25 ENCOUNTER — Encounter (HOSPITAL_COMMUNITY): Payer: Self-pay | Admitting: Pharmacy Technician

## 2011-10-25 ENCOUNTER — Telehealth (HOSPITAL_COMMUNITY): Payer: Self-pay | Admitting: *Deleted

## 2011-11-03 ENCOUNTER — Encounter (HOSPITAL_COMMUNITY)
Admission: RE | Admit: 2011-11-03 | Discharge: 2011-11-03 | Disposition: A | Discharge status: Home / Self Care | Payer: Medicare Other | Source: Ambulatory Visit | Attending: Surgery | Admitting: Surgery

## 2011-11-03 ENCOUNTER — Encounter (HOSPITAL_COMMUNITY): Payer: Self-pay

## 2011-11-03 HISTORY — DX: Major depressive disorder, single episode, unspecified: F32.9

## 2011-11-03 HISTORY — DX: Depression, unspecified: F32.A

## 2011-11-03 HISTORY — DX: Hypothyroidism, unspecified: E03.9

## 2011-11-03 LAB — BASIC METABOLIC PANEL
BUN: 16 mg/dL (ref 6–23)
Calcium: 10.7 mg/dL — ABNORMAL HIGH (ref 8.4–10.5)
Chloride: 101 mEq/L (ref 96–112)
Creatinine, Ser: 0.85 mg/dL (ref 0.50–1.10)
GFR calc Af Amer: 82 mL/min — ABNORMAL LOW (ref >90)

## 2011-11-03 LAB — CBC
HCT: 42.3 % (ref 36.0–46.0)
MCHC: 33.3 g/dL (ref 30.0–36.0)
MCV: 90.8 fL (ref 78.0–100.0)
Platelets: 268 10*3/uL (ref 150–400)
RDW: 13.8 % (ref 11.5–15.5)
WBC: 8.6 10*3/uL (ref 4.0–10.5)

## 2011-11-03 LAB — SURGICAL PCR SCREEN
MRSA, PCR: NEGATIVE
Staphylococcus aureus: NEGATIVE

## 2011-11-03 NOTE — Patient Instructions (Addendum)
[name]  11/03/2011   Your procedure is scheduled on:  Thursday 11/09/2011  Report to [facility] at 1200 pm  Call this number if you have problems the morning of surgery: [phone]   Remember: PATIENT TO STOP PLAVIX AND VITAMINS 5 DAYS PRIOR TO SURGERY PER DR.GERKIN'S ORDERS- STOP ON 11/03/2011!   Do not eat food:after mignight.  May have clear liquids:up to 6 Hours before arrival (up until 8am day of surgery then nothing.  Clear liquids include soda, tea, black coffee, apple or grape juice, broth.  Take these medicines the morning of surgery with A SIP OF WATER: Wellbutrin, Synthroid, Ativan   Do not wear jewelry, make-up or nail polish.  Do not wear lotions, powders, or perfumes.   Do not shave 48 hours prior to surgery.(women only)  Do not bring valuables to the hospital.  Contacts, dentures or bridgework may not be worn into surgery.  Leave suitcase in the car. After surgery it may be brought to your room.  For patients admitted to the hospital, checkout time is 11:00 AM the day of discharge.   Patients discharged the day of surgery will not be allowed to drive home.  Name and phone number of your driver:   Special Instructions: CHG Shower Use Special Wash: 1/2 bottle night before surgery and 1/2 bottle morning of surgery.   Please read over the following fact sheets that you were given: MRSA Information

## 2011-11-04 NOTE — Progress Notes (Signed)
Quick Note:  These results are acceptable for scheduled surgery. TMG ______ 

## 2011-11-06 NOTE — Progress Notes (Signed)
Faxed to [facility]

## 2011-11-09 ENCOUNTER — Encounter (HOSPITAL_COMMUNITY): Payer: Self-pay | Admitting: Certified Registered Nurse Anesthetist

## 2011-11-09 ENCOUNTER — Encounter (HOSPITAL_COMMUNITY): Payer: Self-pay | Admitting: *Deleted

## 2011-11-09 ENCOUNTER — Ambulatory Visit (HOSPITAL_COMMUNITY): Payer: Medicare Other | Admitting: Certified Registered Nurse Anesthetist

## 2011-11-09 ENCOUNTER — Other Ambulatory Visit (INDEPENDENT_AMBULATORY_CARE_PROVIDER_SITE_OTHER): Payer: Self-pay | Admitting: Surgery

## 2011-11-09 ENCOUNTER — Encounter (HOSPITAL_COMMUNITY)
Admission: RE | Disposition: A | Discharge status: Home / Self Care | Payer: Self-pay | Source: Ambulatory Visit | Attending: Surgery

## 2011-11-09 ENCOUNTER — Ambulatory Visit (HOSPITAL_COMMUNITY): Payer: Medicare Other

## 2011-11-09 ENCOUNTER — Inpatient Hospital Stay (HOSPITAL_COMMUNITY)
Admission: RE | Admit: 2011-11-09 | Discharge: 2011-11-10 | DRG: 419 | Disposition: A | Discharge status: Home / Self Care | Payer: Medicare Other | Source: Ambulatory Visit | Attending: Surgery | Admitting: Surgery

## 2011-11-09 DIAGNOSIS — F329 Major depressive disorder, single episode, unspecified: Secondary | ICD-10-CM | POA: Diagnosis present

## 2011-11-09 DIAGNOSIS — Z8673 Personal history of transient ischemic attack (TIA), and cerebral infarction without residual deficits: Secondary | ICD-10-CM

## 2011-11-09 DIAGNOSIS — E119 Type 2 diabetes mellitus without complications: Secondary | ICD-10-CM | POA: Diagnosis present

## 2011-11-09 DIAGNOSIS — F3289 Other specified depressive episodes: Secondary | ICD-10-CM | POA: Diagnosis present

## 2011-11-09 DIAGNOSIS — E039 Hypothyroidism, unspecified: Secondary | ICD-10-CM | POA: Diagnosis present

## 2011-11-09 DIAGNOSIS — K8010 Calculus of gallbladder with chronic cholecystitis without obstruction: Secondary | ICD-10-CM

## 2011-11-09 HISTORY — PX: CHOLECYSTECTOMY: SHX55

## 2011-11-09 LAB — GLUCOSE, CAPILLARY: Glucose-Capillary: 138 mg/dL — ABNORMAL HIGH (ref 70–99)

## 2011-11-09 SURGERY — LAPAROSCOPIC CHOLECYSTECTOMY WITH INTRAOPERATIVE CHOLANGIOGRAM
Anesthesia: General | Site: Abdomen | Wound class: Clean Contaminated

## 2011-11-09 MED ORDER — CEFAZOLIN SODIUM-DEXTROSE 2-3 GM-% IV SOLR
2.0000 g | INTRAVENOUS | Status: AC
  Administered 2011-11-09: 2 g via INTRAVENOUS

## 2011-11-09 MED ORDER — LORAZEPAM 0.5 MG PO TABS
0.5000 mg | ORAL_TABLET | Freq: Every day | ORAL | Status: DC
  Administered 2011-11-10: 0.5 mg via ORAL
  Filled 2011-11-09: qty 1

## 2011-11-09 MED ORDER — PROPOFOL 10 MG/ML IV EMUL
INTRAVENOUS | Status: DC | PRN
  Administered 2011-11-09: 160 mg via INTRAVENOUS

## 2011-11-09 MED ORDER — LACTATED RINGERS IV SOLN
INTRAVENOUS | Status: DC
  Administered 2011-11-09: 15:00:00 via INTRAVENOUS
  Administered 2011-11-09: 1000 mL via INTRAVENOUS

## 2011-11-09 MED ORDER — CEFAZOLIN SODIUM 1-5 GM-% IV SOLN: 1.0000 g | INTRAVENOUS | Status: DC

## 2011-11-09 MED ORDER — LACTATED RINGERS IV SOLN
INTRAVENOUS | Status: DC
  Administered 2011-11-09: 50 mL via INTRAVENOUS

## 2011-11-09 MED ORDER — TRIAMTERENE-HCTZ 37.5-25 MG PO TABS
0.5000 | ORAL_TABLET | Freq: Every day | ORAL | Status: DC
  Administered 2011-11-10: 0.5 via ORAL
  Filled 2011-11-09: qty 0.5

## 2011-11-09 MED ORDER — ONDANSETRON HCL 4 MG/2ML IJ SOLN
INTRAMUSCULAR | Status: DC | PRN
  Administered 2011-11-09: 4 mg via INTRAVENOUS

## 2011-11-09 MED ORDER — FENTANYL CITRATE 0.05 MG/ML IJ SOLN
INTRAMUSCULAR | Status: DC | PRN
  Administered 2011-11-09 (×3): 50 ug via INTRAVENOUS
  Administered 2011-11-09: 100 ug via INTRAVENOUS

## 2011-11-09 MED ORDER — LACTATED RINGERS IR SOLN
Status: DC | PRN
  Administered 2011-11-09: 1000 mL

## 2011-11-09 MED ORDER — LORAZEPAM 1 MG PO TABS
1.0000 mg | ORAL_TABLET | ORAL | Status: DC
Start: 2011-11-09 — End: 2011-11-10
  Administered 2011-11-09 – 2011-11-10 (×2): 1 mg via ORAL
  Filled 2011-11-09 (×2): qty 1

## 2011-11-09 MED ORDER — BUPIVACAINE-EPINEPHRINE 0.5% -1:200000 IJ SOLN
INTRAMUSCULAR | Status: DC | PRN
  Administered 2011-11-09: 15 mL

## 2011-11-09 MED ORDER — IOHEXOL 300 MG/ML  SOLN
INTRAMUSCULAR | Status: DC | PRN
  Administered 2011-11-09: 21 mL via INTRAVENOUS

## 2011-11-09 MED ORDER — BUPROPION HCL ER (SR) 150 MG PO TB12
150.0000 mg | ORAL_TABLET | Freq: Two times a day (BID) | ORAL | Status: DC
  Administered 2011-11-09 – 2011-11-10 (×2): 150 mg via ORAL
  Filled 2011-11-09 (×3): qty 1

## 2011-11-09 MED ORDER — SERTRALINE HCL 50 MG PO TABS
50.0000 mg | ORAL_TABLET | Freq: Every day | ORAL | Status: DC
Start: 2011-11-09 — End: 2011-11-10
  Administered 2011-11-09: 50 mg via ORAL
  Filled 2011-11-09 (×2): qty 1

## 2011-11-09 MED ORDER — PROMETHAZINE HCL 25 MG/ML IJ SOLN: 12.5000 mg | Freq: Four times a day (QID) | INTRAMUSCULAR | Status: DC | PRN

## 2011-11-09 MED ORDER — HYDROCODONE-ACETAMINOPHEN 5-325 MG PO TABS
1.0000 | ORAL_TABLET | ORAL | Status: DC | PRN
  Administered 2011-11-10: 1 via ORAL
  Filled 2011-11-09: qty 1

## 2011-11-09 MED ORDER — ROCURONIUM BROMIDE 100 MG/10ML IV SOLN
INTRAVENOUS | Status: DC | PRN
  Administered 2011-11-09: 35 mg via INTRAVENOUS
  Administered 2011-11-09: 10 mg via INTRAVENOUS

## 2011-11-09 MED ORDER — LACTATED RINGERS IV SOLN: INTRAVENOUS | Status: DC | PRN

## 2011-11-09 MED ORDER — HYDROMORPHONE HCL PF 1 MG/ML IJ SOLN
0.2500 mg | INTRAMUSCULAR | Status: DC | PRN
  Administered 2011-11-09: 0.5 mg via INTRAVENOUS

## 2011-11-09 MED ORDER — PROMETHAZINE HCL 25 MG/ML IJ SOLN: 6.2500 mg | INTRAMUSCULAR | Status: DC | PRN

## 2011-11-09 MED ORDER — LINAGLIPTIN 5 MG PO TABS
5.0000 mg | ORAL_TABLET | Freq: Every day | ORAL | Status: DC
  Administered 2011-11-09 – 2011-11-10 (×2): 5 mg via ORAL
  Filled 2011-11-09 (×2): qty 1

## 2011-11-09 MED ORDER — HYDROMORPHONE HCL PF 1 MG/ML IJ SOLN
1.0000 mg | INTRAMUSCULAR | Status: DC | PRN
  Administered 2011-11-09 (×2): 1 mg via INTRAVENOUS
  Filled 2011-11-09 (×2): qty 1

## 2011-11-09 SURGICAL SUPPLY — 42 items
APL SKNCLS STERI-STRIP NONHPOA (GAUZE/BANDAGES/DRESSINGS) ×1
APPLIER CLIP ROT 10 11.4 M/L (STAPLE) ×2
APR CLP MED LRG 11.4X10 (STAPLE) ×1
BAG SPEC RTRVL LRG 6X4 10 (ENDOMECHANICALS) ×1
BENZOIN TINCTURE PRP APPL 2/3 (GAUZE/BANDAGES/DRESSINGS) ×2 IMPLANT
CABLE HIGH FREQUENCY MONO STRZ (ELECTRODE) ×2 IMPLANT
CANISTER SUCTION 2500CC (MISCELLANEOUS) ×2 IMPLANT
CHLORAPREP W/TINT 26ML (MISCELLANEOUS) ×2 IMPLANT
CLIP APPLIE ROT 10 11.4 M/L (STAPLE) ×1 IMPLANT
CLOSURE STERI STRIP 1/2 X4 (GAUZE/BANDAGES/DRESSINGS) ×1 IMPLANT
CLOTH BEACON ORANGE TIMEOUT ST (SAFETY) ×2 IMPLANT
COVER MAYO STAND STRL (DRAPES) ×2 IMPLANT
DECANTER SPIKE VIAL GLASS SM (MISCELLANEOUS) ×2 IMPLANT
DRAPE C-ARM 42X72 X-RAY (DRAPES) ×2 IMPLANT
DRAPE LAPAROSCOPIC ABDOMINAL (DRAPES) ×2 IMPLANT
ELECT REM PT RETURN 9FT ADLT (ELECTROSURGICAL) ×2
ELECTRODE REM PT RTRN 9FT ADLT (ELECTROSURGICAL) ×1 IMPLANT
GAUZE SPONGE 2X2 8PLY STRL LF (GAUZE/BANDAGES/DRESSINGS) IMPLANT
GLOVE BIOGEL PI IND STRL 7.0 (GLOVE) ×1 IMPLANT
GLOVE BIOGEL PI INDICATOR 7.0 (GLOVE) ×1
GLOVE SURG ORTHO 8.0 STRL STRW (GLOVE) ×2 IMPLANT
GOWN STRL NON-REIN LRG LVL3 (GOWN DISPOSABLE) ×2 IMPLANT
GOWN STRL REIN XL XLG (GOWN DISPOSABLE) ×4 IMPLANT
HEMOSTAT SURGICEL 4X8 (HEMOSTASIS) IMPLANT
KIT BASIN OR (CUSTOM PROCEDURE TRAY) ×2 IMPLANT
NS IRRIG 1000ML POUR BTL (IV SOLUTION) ×2 IMPLANT
POUCH SPECIMEN RETRIEVAL 10MM (ENDOMECHANICALS) ×2 IMPLANT
SCISSORS LAP 5X35 DISP (ENDOMECHANICALS) IMPLANT
SET CHOLANGIOGRAPH MIX (MISCELLANEOUS) ×2 IMPLANT
SET IRRIG TUBING LAPAROSCOPIC (IRRIGATION / IRRIGATOR) ×2 IMPLANT
SLEEVE Z-THREAD 5X100MM (TROCAR) ×2 IMPLANT
SOLUTION ANTI FOG 6CC (MISCELLANEOUS) ×2 IMPLANT
SPONGE GAUZE 2X2 STER 10/PKG (GAUZE/BANDAGES/DRESSINGS) ×1
STRIP CLOSURE SKIN 1/2X4 (GAUZE/BANDAGES/DRESSINGS) ×2 IMPLANT
SUT MNCRL AB 4-0 PS2 18 (SUTURE) ×2 IMPLANT
TAPE CLOTH SURG 4X10 WHT LF (GAUZE/BANDAGES/DRESSINGS) ×1 IMPLANT
TOWEL OR 17X26 10 PK STRL BLUE (TOWEL DISPOSABLE) ×6 IMPLANT
TRAY LAP CHOLE (CUSTOM PROCEDURE TRAY) ×2 IMPLANT
TROCAR XCEL BLUNT TIP 100MML (ENDOMECHANICALS) ×2 IMPLANT
TROCAR Z-THREAD FIOS 11X100 BL (TROCAR) ×2 IMPLANT
TROCAR Z-THREAD FIOS 5X100MM (TROCAR) ×2 IMPLANT
TUBING INSUFFLATION 10FT LAP (TUBING) ×2 IMPLANT

## 2011-11-09 NOTE — Brief Op Note (Signed)
11/09/2011  3:40 PM  PATIENT:  Brittney Moss  66 y.o. female  PRE-OPERATIVE DIAGNOSIS:  Cholelithiasis, rule out choledocholithiasis  POST-OPERATIVE DIAGNOSIS:  Chronic cholecystitis, cholelithiasis   PROCEDURE:  LAPAROSCOPIC CHOLECYSTECTOMY WITH INTRAOPERATIVE CHOLANGIOGRAM  SURGEON:  Velora Heckler, MD, FACS  ASSISTANTS: none   ANESTHESIA:   general  EBL:  Total I/O In: 1000 [I.V.:1000] Out: -   BLOOD ADMINISTERED:none  DRAINS: none   LOCAL MEDICATIONS USED:  NONE  SPECIMEN:  Excision  DISPOSITION OF SPECIMEN:  PATHOLOGY  COUNTS:  YES  TOURNIQUET:  * No tourniquets in log *  DICTATION: .Other Dictation: Dictation Number [id number]  PLAN OF CARE: Admit for overnight observation  PATIENT DISPOSITION:  PACU - hemodynamically stable.   Velora Heckler, MD, FACS General & Endocrine Surgery [facility]

## 2011-11-09 NOTE — Transfer of Care (Signed)
Immediate Anesthesia Transfer of Care Note  Patient: Brittney Moss  Procedure(s) Performed:  LAPAROSCOPIC CHOLECYSTECTOMY WITH INTRAOPERATIVE CHOLANGIOGRAM  Patient Location: PACU  Anesthesia Type: General  Level of Consciousness: sedated and patient cooperative  Airway & Oxygen Therapy: Patient Spontanous Breathing and Patient connected to face mask oxygen  Post-op Assessment: Report given to PACU RN and Post -op Vital signs reviewed and stable  Post vital signs: Reviewed and stable  Complications: No apparent anesthesia complications

## 2011-11-09 NOTE — H&P (View-Only) (Signed)
Chief Complaint  Patient presents with  . New Evaluation    cholelithiasis - referral by Dr. Wayland Salinas and Dr. Jerelyn Scott, [place]    HISTORY: Patient is a 66 year old white female referred from [facility] emergency department for symptomatic cholelithiasis. The patient presented with right upper quadrant abdominal pain on October 02, 2011. She had laboratory studies which showed elevated liver function test. Lipase was slightly elevated at 65. Abdominal ultrasound showed multiple gallstones with sludge. Patient's pain resolved spontaneously. She has had no further discomfort. Patient presents today for evaluation for possible cholecystectomy.  No family history of hepatobiliary disease. Patient denies jaundice or acholic stools. Her only previous abdominal surgery was a tubal ligation. Patient is a type II diabetic.   Past Medical History  Diagnosis Date  . Thyroid disease   . Diabetes mellitus   . High cholesterol   . Stroke 2000     Current Outpatient Prescriptions  Medication Sig Dispense Refill  . buPROPion (WELLBUTRIN SR) 150 MG 12 hr tablet Take 150 mg by mouth 2 (two) times daily.        . Cholecalciferol 1000 UNITS tablet Take 1,000 Units by mouth daily.        . clopidogrel (PLAVIX) 75 MG tablet Take 75 mg by mouth daily.        Marland Kitchen levothyroxine (SYNTHROID, LEVOTHROID) 125 MCG tablet Take 125 mcg by mouth daily.        Marland Kitchen LORazepam (ATIVAN) 0.5 MG tablet Take 0.5 mg by mouth every morning.        Marland Kitchen LORazepam (ATIVAN) 1 MG tablet Take 1 mg by mouth 2 (two) times daily. At noon and bedtime       . mineral oil external liquid Place 2 drops into both ears once a week. On Wednesday       . Multiple Vitamins-Minerals (MULTIVITAMINS THER. W/MINERALS) TABS Take 1 tablet by mouth daily.        . pravastatin (PRAVACHOL) 20 MG tablet Take 60 mg by mouth every evening.        . sertraline (ZOLOFT) 50 MG tablet Take 50 mg by mouth every evening.        . sitaGLIPtin (JANUVIA) 100 MG  tablet Take 100 mg by mouth every morning.        . triamterene-hydrochlorothiazide (MAXZIDE-25) 37.5-25 MG per tablet Take 0.5 tablets by mouth every morning.           No Known Allergies   History reviewed. No pertinent family history.   History   Social History  . Marital Status: Widowed    Spouse Name: N/A    Number of Children: N/A  . Years of Education: N/A   Social History Main Topics  . Smoking status: Never Smoker   . Smokeless tobacco: Never Used  . Alcohol Use: No  . Drug Use: No  . Sexually Active:    Other Topics Concern  . None   Social History Narrative  . None     REVIEW OF SYSTEMS - PERTINENT POSITIVES ONLY: Single episode right upper quadrant abdominal pain. Denies jaundice. Denies acholic stools. Denies hepatitis. Denies pancreatitis.   EXAM: Filed Vitals:   10/17/11 1316  BP: 126/88  Pulse: 60  Temp: 97.4 F (36.3 C)  Resp: 16    HEENT: normocephalic; pupils equal and reactive; sclerae clear; dentition good; mucous membranes moist NECK:  No nodules; symmetric on extension; no palpable anterior or posterior cervical lymphadenopathy; no supraclavicular masses; no tenderness CHEST:  clear to auscultation bilaterally without rales, rhonchi, or wheezes CARDIAC: regular rate and rhythm without significant murmur; peripheral pulses are full ABDOMEN: Soft without distention. Well-healed small surgical wounds suprapubic. No sign of hernia. No hepatosplenomegaly. No masses. No tenderness. EXT:  non-tender without edema; no deformity NEURO: no gross focal deficits; no sign of tremor; short-term memory problems are obvious   LABORATORY RESULTS: See E-Chart for most recent results   RADIOLOGY RESULTS: See E-Chart or I-Site for most recent results   IMPRESSION: #1 symptomatic cholelithiasis #2 rule out choledocholithiasis #3 history of cerebrovascular accident on Plavix #4 type 2 diabetes   PLAN: I discussed the indications for  cholecystectomy with the patient and with her 2 sisters who accompany her today. I have recommended that we repeat her liver function test and her lipase levels today. If these remain elevated then I believe she will need consultation with gastroenterology and possible ERCP. If liver function tests have returned to normal levels, then I think she should proceed electively with cholecystectomy in the near future.  We have discussed laparoscopic cholecystectomy. We have discussed the risk and benefits of the procedure. I have provided her with written literature to review. This can be done in the near future at a time convenient for the patient.  The risks and benefits of the procedure have been discussed at length with the patient.  The patient understands the proposed procedure, potential alternative treatments, and the course of recovery to be expected.  All of the patient's questions have been answered at this time.  The patient wishes to proceed with surgery and will schedule a date for their procedure through our office staff.   Velora Heckler, MD, FACS General & Endocrine Surgery [facility]    Visit Diagnoses: 1. Cholelithiasis with cholecystitis     Primary Care Physician: Florentina Jenny, MD  ER:  Dr. Wayland Salinas and Dr. Jerelyn Scott

## 2011-11-09 NOTE — Anesthesia Postprocedure Evaluation (Signed)
  Anesthesia Post-op Note  Patient: Brittney Moss  Procedure(s) Performed:  LAPAROSCOPIC CHOLECYSTECTOMY WITH INTRAOPERATIVE CHOLANGIOGRAM  Patient Location: PACU  Anesthesia Type: General  Level of Consciousness: awake and alert   Airway and Oxygen Therapy: Patient Spontanous Breathing  Post-op Pain: mild  Post-op Assessment: Post-op Vital signs reviewed, Patient's Cardiovascular Status Stable, Respiratory Function Stable, Patent Airway and No signs of Nausea or vomiting  Post-op Vital Signs: stable  Complications: No apparent anesthesia complications

## 2011-11-09 NOTE — Interval H&P Note (Signed)
History and Physical Interval Note:  11/09/2011 2:05 PM  Brittney Moss  has presented today for surgery, with the diagnosis of cholelithiasis.   The various methods of treatment have been discussed with the patient and family. After consideration of risks, benefits and other options for treatment, the patient has consented to   Procedure(s):  LAPAROSCOPIC CHOLECYSTECTOMY WITH INTRAOPERATIVE CHOLANGIOGRAM as a surgical intervention .    The patients' history has been reviewed, patient examined, no change in status, stable for surgery.  I have reviewed the patients' chart and labs.  Questions were answered to the patient's satisfaction.    Velora Heckler, MD, FACS General & Endocrine Surgery [facility]  [name]

## 2011-11-09 NOTE — Anesthesia Preprocedure Evaluation (Signed)
Anesthesia Evaluation    Airway Mallampati: II TM Distance: >3 FB Neck ROM: Full    Dental No notable dental hx.    Pulmonary  clear to auscultation  Pulmonary exam normal       Cardiovascular Regular Normal    Neuro/Psych PSYCHIATRIC DISORDERS Depression CVA    GI/Hepatic   Endo/Other  Diabetes mellitus-, Type 2, Oral Hypoglycemic AgentsHypothyroidism   Renal/GU      Musculoskeletal   Abdominal   Peds  Hematology   Anesthesia Other Findings   Reproductive/Obstetrics                           Anesthesia Physical Anesthesia Plan  ASA: III  Anesthesia Plan: General   Post-op Pain Management:    Induction: Intravenous  Airway Management Planned: Oral ETT  Additional Equipment:   Intra-op Plan:   Post-operative Plan: Extubation in OR  Informed Consent: I have reviewed the patients History and Physical, chart, labs and discussed the procedure including the risks, benefits and alternatives for the proposed anesthesia with the patient or authorized representative who has indicated his/her understanding and acceptance.   Dental advisory given  Plan Discussed with: CRNA  Anesthesia Plan Comments:         Anesthesia Quick Evaluation

## 2011-11-10 LAB — CBC
MCH: 30 pg (ref 26.0–34.0)
MCHC: 33.2 g/dL (ref 30.0–36.0)
MCV: 90.3 fL (ref 78.0–100.0)
Platelets: 220 10*3/uL (ref 150–400)
RBC: 4.34 MIL/uL (ref 3.87–5.11)

## 2011-11-10 LAB — BASIC METABOLIC PANEL
BUN: 9 mg/dL (ref 6–23)
CO2: 26 mEq/L (ref 19–32)
Calcium: 9.2 mg/dL (ref 8.4–10.5)
Glucose, Bld: 115 mg/dL — ABNORMAL HIGH (ref 70–99)
Sodium: 139 mEq/L (ref 135–145)

## 2011-11-10 MED ORDER — HYDROCODONE-ACETAMINOPHEN 5-325 MG PO TABS: 1.0000 | ORAL_TABLET | ORAL | Status: AC | PRN

## 2011-11-10 NOTE — Progress Notes (Signed)
CSW spoke with Cordelia Pen from [facility], she reports pt is to return home with her sisters for a few days before returning to the facility. Pt has been at the family members home prior to her admission in the hospital. They do not need an FL-2 because the pt has been here less than 24 hours. CSW has provided care coordinator Clarissa and the pts family will transport the pt home. No CSW needs identified at this time.  CSW signing off. Patrice Paradise, LCSWA 11/10/2011 12:20 PM  [phone]

## 2011-11-10 NOTE — Discharge Summary (Signed)
  Physician Discharge Summary [facility]  Patient ID: Brittney Moss MRN: [id number] DOB/AGE: [birth year] 66 y.o.  Admit date: 11/09/2011 Discharge date: 11/10/2011  Admission Diagnoses:  Symptomatic cholelithiasis  Discharge Diagnoses:  Active Problems:  * No active hospital problems. *    Discharged Condition: good  Hospital Course: Patient admitted after lap chole with IOC.  No complications.  Hgb stable overnight.  Prepared for discharge on POD#1.  Consults: none  Significant Diagnostic Studies: CBC  Treatments: surgery: lap chole with IOC  Discharge Exam: Blood pressure 106/71, pulse 90, temperature 98.4 F (36.9 C), temperature source Oral, resp. rate 17, SpO2 93.00%. Chest - clear Cor - RRR Abd - dressings dry and intact, soft, min tender Ext - no edema  Disposition: Home with sister  Discharge Orders    Future Appointments: Provider: Department: Dept Phone: Center:   11/29/2011 10:15 AM Velora Heckler, MD [facility] [phone] None     Current Discharge Medication List    CONTINUE these medications which have NOT CHANGED   Details  buPROPion (WELLBUTRIN SR) 150 MG 12 hr tablet Take 150 mg by mouth 2 (two) times daily.     Cholecalciferol 1000 UNITS tablet Take 1,000 Units by mouth daily.     clopidogrel (PLAVIX) 75 MG tablet Take 75 mg by mouth daily after breakfast.     levothyroxine (SYNTHROID, LEVOTHROID) 125 MCG tablet Take 100 mcg by mouth daily at 6 (six) AM. Sister states this was reduced to 100 mcg daily    !! LORazepam (ATIVAN) 0.5 MG tablet Take 0.5 mg by mouth every morning.     !! LORazepam (ATIVAN) 1 MG tablet Take 1 mg by mouth 2 (two) times daily. At noon and bedtime    mineral oil external liquid Place 2 drops into both ears once a week. On Wednesday    Multiple Vitamins-Minerals (MULTIVITAMINS THER. W/MINERALS) TABS Take 1 tablet by mouth daily.     pravastatin (PRAVACHOL) 20 MG tablet Take 60 mg by mouth every  evening.     sertraline (ZOLOFT) 50 MG tablet Take 50 mg by mouth every evening.     sitaGLIPtin (JANUVIA) 100 MG tablet Take 100 mg by mouth every morning.     triamterene-hydrochlorothiazide (MAXZIDE-25) 37.5-25 MG per tablet Take 0.5 tablets by mouth every morning.      !! - Potential duplicate medications found. Please discuss with provider.     Velora Heckler, MD, FACS General & Endocrine Surgery [facility]   Signed: Velora Heckler 11/10/2011, 2:27 PM

## 2011-11-10 NOTE — Op Note (Signed)
Brittney Moss, Brittney Moss              ACCOUNT NO.:  [id number]  MEDICAL RECORD NO.:  [id number]  LOCATION:  1522                         FACILITY:  [facility]  PHYSICIAN:  Velora Heckler, MD      DATE OF BIRTH:  08-22-1946  DATE OF PROCEDURE:  11/09/2011                               OPERATIVE REPORT   PREOPERATIVE DIAGNOSIS:  Symptomatic cholelithiasis, rule out choledocholithiasis.  POSTOPERATIVE DIAGNOSES:  Chronic cholecystitis, cholelithiasis.  PROCEDURE:  Laparoscopic cholecystectomy with intraoperative cholangiography.  SURGEON:  Velora Heckler, MD, FACS  ANESTHESIA:  General per Dr. Sherrian Divers.  ESTIMATED BLOOD LOSS:  Minimal.  PREPARATION:  ChloraPrep.  COMPLICATIONS:  None.  INDICATIONS:  The patient is a 66 year old white female who presented to the emergency department with abdominal pain.  Liver function tests were mildly elevated.  Followup liver function tests returned to normal. Ultrasound documented multiple gallstones.  The patient now comes to surgery for cholecystectomy with intraoperative cholangiography to rule out choledocholithiasis.  BODY OF REPORT:  Procedure was done in OR #1 at [facility].  The patient was brought to the operating room, placed in supine position on the operating room table.  Following administration of general anesthesia, the patient was positioned and then prepped and draped in the usual strict aseptic fashion.  After ascertaining that an adequate level of anesthesia had been achieved, an infraumbilical incision was made with a #15 blade.  Dissection was carried down to the fascia.  Fascia was incised in the midline and the peritoneal cavity is entered cautiously.  A 0 Vicryl pursestring suture was placed in the fascia.  An Hasson cannula was introduced under direct vision and secured with a pursestring suture.  Abdomen was insufflated with carbon dioxide.  Laparoscope was introduced and the abdomen  explored. Operative ports were placed along the right costal margin in the midline, midclavicular line, and anterior axillary line.  Fundus of the gallbladder is dissected out of omental adhesions using electrocautery for hemostasis.  Fundus of the gallbladder was then grasped and retracted cephalad.  Omental adhesions to the gallbladder were taken down with blunt dissection and hemostasis obtained with the electrocautery.  Dissection was began at the neck of the gallbladder. Peritoneum was incised.  Cystic duct was dissected out.  Cystic artery was identified and dissected out.  Cystic artery was doubly clipped proximally and distally and divided.  Clip was placed at the junction of the cystic duct and the neck of the gallbladder.  Cystic duct was incised.  A Cook cholangiography catheter was introduced through a stab wound in the right upper quadrant and inserted into the cystic duct.  It was secured with a Ligaclip.  Using C-arm fluoroscopy, real time cholangiography was performed.  There was rapid filling of a mildly dilated biliary tree.  There was free flow distally into the duodenum. The patient was placed in Trendelenburg, an additional contrast was delivered so as to fill the upper hepatic ducts both in the right and left segments.  Clip was withdrawn and Cook catheter was removed from the peritoneal cavity.  Cystic duct was doubly clipped and divided.  Gallbladder was then excised from the gallbladder bed using the  hook electrocautery for hemostasis.  Gallbladder was completely excised and placed into an EndoCatch bag.  It was withdrawn through the umbilical port without difficulty.  Right upper quadrant was irrigated with warm saline and good hemostasis was achieved.  Pneumoperitoneum was released.  Ports were removed under direct vision and good hemostasis was noted all port sites.  A 0 Vicryl pursestring suture was tied securely.  Port sites are cauterized with the  electrocautery to achieve hemostasis.  Local anesthetic is injected at each port site.  Incisions were closed with interrupted 4-0 Monocryl subcuticular sutures.  Wounds are washed and dried and benzoin and Steri-Strips were applied.  Sterile dressings were applied.  The patient was awakened from anesthesia and brought to the recovery room.  The patient tolerated the procedure well.   Velora Heckler, MD,FACS     TMG/MEDQ  D:  11/09/2011  T:  11/10/2011  Job:  [id number]  cc:   Florentina Jenny, MD Fax: [phone]

## 2011-11-10 NOTE — Progress Notes (Signed)
Patient is being discharged home with family.

## 2011-11-13 ENCOUNTER — Encounter (HOSPITAL_COMMUNITY): Payer: Self-pay | Admitting: Surgery

## 2011-11-29 ENCOUNTER — Encounter (INDEPENDENT_AMBULATORY_CARE_PROVIDER_SITE_OTHER): Payer: Self-pay | Admitting: Surgery

## 2011-11-29 ENCOUNTER — Ambulatory Visit (INDEPENDENT_AMBULATORY_CARE_PROVIDER_SITE_OTHER): Payer: Medicare Other | Admitting: Surgery

## 2011-11-29 VITALS — BP 126/88 | HR 70 | Temp 98.1°F | Resp 16 | Ht 62.0 in | Wt 174.2 lb

## 2011-11-29 DIAGNOSIS — K8010 Calculus of gallbladder with chronic cholecystitis without obstruction: Secondary | ICD-10-CM

## 2011-11-29 NOTE — Patient Instructions (Signed)
  COCOA BUTTER & VITAMIN E CREAM  ([employer] or other brand)  Apply cocoa butter/vitamin E cream to your incision 2 - 3 times daily.  Massage cream into incision for one minute with each application.  Use sunscreen (50 SPF or higher) for first 6 months after surgery if area is exposed to sun.  You may substitute Mederma or other scar reducing creams as desired.   

## 2011-11-29 NOTE — Progress Notes (Signed)
Visit Diagnoses: 1. Cholelithiasis with cholecystitis     HISTORY: Patient returns for her first postoperative visit having undergone laparoscopic cholecystectomy with intraoperative cholangiography on November 09, 2011. Final pathology showed chronic cholecystitis and cholelithiasis.  Patient's postoperative course has been uneventful. She has no complaints.  EXAM: Abdomen is soft, nontender, without distention. Surgical wounds are well healed. No sign of herniation. No sign of infection. Right upper quadrant is soft and nontender without mass.  IMPRESSION: Status post laparoscopic cholecystectomy without complication.  PLAN: Patient will begin applying topical creams her incisions. She is released without restriction. She will return to see says needed.  Velora Heckler, MD, FACS General & Endocrine Surgery [facility]

## 2012-05-27 ENCOUNTER — Other Ambulatory Visit: Payer: Self-pay | Admitting: Geriatric Medicine

## 2012-05-27 DIAGNOSIS — Z1231 Encounter for screening mammogram for malignant neoplasm of breast: Secondary | ICD-10-CM

## 2012-06-12 ENCOUNTER — Ambulatory Visit
Admission: RE | Admit: 2012-06-12 | Discharge: 2012-06-12 | Disposition: A | Discharge status: Home / Self Care | Payer: Medicare Other | Source: Ambulatory Visit | Attending: Geriatric Medicine | Admitting: Geriatric Medicine

## 2012-06-12 DIAGNOSIS — Z1231 Encounter for screening mammogram for malignant neoplasm of breast: Secondary | ICD-10-CM

## 2012-07-26 ENCOUNTER — Encounter: Payer: Self-pay | Admitting: Internal Medicine

## 2013-04-08 ENCOUNTER — Telehealth: Payer: Self-pay | Admitting: Neurology

## 2013-04-09 NOTE — Telephone Encounter (Signed)
Patient will be called and assigned a doctor.

## 2013-04-14 ENCOUNTER — Telehealth: Payer: Self-pay | Admitting: *Deleted

## 2013-04-14 ENCOUNTER — Encounter: Payer: Self-pay | Admitting: *Deleted

## 2013-04-14 NOTE — Telephone Encounter (Signed)
Will mail the patient a letter

## 2013-07-18 ENCOUNTER — Emergency Department (HOSPITAL_COMMUNITY)
Admission: EM | Admit: 2013-07-18 | Discharge: 2013-07-18 | Disposition: A | Discharge status: Home / Self Care | Payer: Medicare Other | Attending: Emergency Medicine | Admitting: Emergency Medicine

## 2013-07-18 ENCOUNTER — Emergency Department (HOSPITAL_COMMUNITY): Payer: Medicare Other

## 2013-07-18 ENCOUNTER — Encounter (HOSPITAL_COMMUNITY): Payer: Self-pay | Admitting: Emergency Medicine

## 2013-07-18 DIAGNOSIS — W010XXA Fall on same level from slipping, tripping and stumbling without subsequent striking against object, initial encounter: Secondary | ICD-10-CM | POA: Insufficient documentation

## 2013-07-18 DIAGNOSIS — F068 Other specified mental disorders due to known physiological condition: Secondary | ICD-10-CM | POA: Insufficient documentation

## 2013-07-18 DIAGNOSIS — Z79899 Other long term (current) drug therapy: Secondary | ICD-10-CM | POA: Insufficient documentation

## 2013-07-18 DIAGNOSIS — Y9210 Unspecified residential institution as the place of occurrence of the external cause: Secondary | ICD-10-CM | POA: Insufficient documentation

## 2013-07-18 DIAGNOSIS — Y9389 Activity, other specified: Secondary | ICD-10-CM | POA: Insufficient documentation

## 2013-07-18 DIAGNOSIS — R51 Headache: Secondary | ICD-10-CM | POA: Insufficient documentation

## 2013-07-18 DIAGNOSIS — S0990XA Unspecified injury of head, initial encounter: Secondary | ICD-10-CM

## 2013-07-18 DIAGNOSIS — S0181XA Laceration without foreign body of other part of head, initial encounter: Secondary | ICD-10-CM

## 2013-07-18 DIAGNOSIS — E7800 Pure hypercholesterolemia, unspecified: Secondary | ICD-10-CM | POA: Insufficient documentation

## 2013-07-18 DIAGNOSIS — S0083XA Contusion of other part of head, initial encounter: Secondary | ICD-10-CM

## 2013-07-18 DIAGNOSIS — E119 Type 2 diabetes mellitus without complications: Secondary | ICD-10-CM | POA: Insufficient documentation

## 2013-07-18 DIAGNOSIS — E039 Hypothyroidism, unspecified: Secondary | ICD-10-CM | POA: Insufficient documentation

## 2013-07-18 DIAGNOSIS — F329 Major depressive disorder, single episode, unspecified: Secondary | ICD-10-CM | POA: Insufficient documentation

## 2013-07-18 DIAGNOSIS — F3289 Other specified depressive episodes: Secondary | ICD-10-CM | POA: Insufficient documentation

## 2013-07-18 DIAGNOSIS — S0180XA Unspecified open wound of other part of head, initial encounter: Secondary | ICD-10-CM | POA: Insufficient documentation

## 2013-07-18 DIAGNOSIS — Z8673 Personal history of transient ischemic attack (TIA), and cerebral infarction without residual deficits: Secondary | ICD-10-CM | POA: Insufficient documentation

## 2013-07-18 NOTE — ED Notes (Signed)
Per EMS: pt fell this morning face first at nursing home ([facility]) and hit the ground. Hx subdural hematoma, hx bicerebral strokes, takes plavix. No LOC. Fall was witnessed.  C/O r knee stiffness, denies neck or back pain, small abrasion to forehead and small lac between eyes from pt's glasses.  BP 142/74, HR 88, RR 18, CBG 118, 97% RA.

## 2013-07-18 NOTE — Discharge Instructions (Signed)
 Head Injury, Adult You have had a head injury that does not appear serious at this time. A concussion is a state of changed mental ability, usually from a blow to the head. You should take clear liquids for the rest of the day and then resume your regular diet. You should not take sedatives or alcoholic beverages for as long as directed by your caregiver after discharge. After injuries such as yours, most problems occur within the first 24 hours. SYMPTOMS These minor symptoms may be experienced after discharge:  Memory difficulties.  Dizziness.  Headaches.  Double vision.  Hearing difficulties.  Depression.  Tiredness.  Weakness.  Difficulty with concentration. If you experience any of these problems, you should not be alarmed. A concussion requires a few days for recovery. Many patients with head injuries frequently experience such symptoms. Usually, these problems disappear without medical care. If symptoms last for more than one day, notify your caregiver. See your caregiver sooner if symptoms are becoming worse rather than better. HOME CARE INSTRUCTIONS   During the next 24 hours you must stay with someone who can watch you for the warning signs listed below. Although it is unlikely that serious side effects will occur, you should be aware of signs and symptoms which may necessitate your return to this location. Side effects may occur up to 7  10 days following the injury. It is important for you to carefully monitor your condition and contact your caregiver or seek immediate medical attention if there is a change in your condition. SEEK IMMEDIATE MEDICAL CARE IF:   There is confusion or drowsiness.  You can not awaken the injured person.  There is nausea (feeling sick to your stomach) or continued, forceful vomiting.  You notice dizziness or unsteadiness which is getting worse, or inability to walk.  You have convulsions or unconsciousness.  You experience severe,  persistent headaches not relieved by over-the-counter or prescription medicines for pain. (Do not take aspirin as this impairs clotting abilities). Take other pain medications only as directed.  You can not use arms or legs normally.  There is clear or bloody discharge from the nose or ears. MAKE SURE YOU:   Understand these instructions.  Will watch your condition.  Will get help right away if you are not doing well or get worse. Document Released: 10/23/2005 Document Revised: 01/15/2012 Document Reviewed: 09/10/2009 [facility] Patient Information 2014 [place], [place].  Facial Laceration A facial laceration is a cut on the face. Lacerations usually heal quickly, but they need special care to reduce scarring. It will take 1 to 2 years for the scar to lose its redness and to heal completely. TREATMENT  Some facial lacerations may not require closure. Some lacerations may not be able to be closed due to an increased risk of infection. It is important to see your caregiver as soon as possible after an injury to minimize the risk of infection and to maximize the opportunity for successful closure. If closure is appropriate, pain medicines may be given, if needed. The wound will be cleaned to help prevent infection. Your caregiver will use stitches (sutures), staples, wound glue (adhesive), or skin adhesive strips to repair the laceration. These tools bring the skin edges together to allow for faster healing and a better cosmetic outcome. However, all wounds will heal with a scar.  Once the wound has healed, scarring can be minimized by covering the wound with sunscreen during the day for 1 full year. Use a sunscreen with an SPF of  at least 30. Sunscreen helps to reduce the pigment that will form in the scar. When applying sunscreen to a completely healed wound, massage the scar for a few minutes to help reduce the appearance of the scar. Use circular motions with your fingertips, on and around the  scar. Do not massage a healing wound. HOME CARE INSTRUCTIONS For sutures:  Keep the wound clean and dry.  If you were given a bandage (dressing), you should change it at least once a day. Also change the dressing if it becomes wet or dirty, or as directed by your caregiver.  Wash the wound with soap and water 2 times a day. Rinse the wound off with water to remove all soap. Pat the wound dry with a clean towel.  After cleaning, apply a thin layer of the antibiotic ointment recommended by your caregiver. This will help prevent infection and keep the dressing from sticking.  You may shower as usual after the first 24 hours. Do not soak the wound in water until the sutures are removed.  Only take over-the-counter or prescription medicines for pain, discomfort, or fever as directed by your caregiver.  Get your sutures removed as directed by your caregiver. With facial lacerations, sutures should usually be taken out after 4 to 5 days to avoid stitch marks.  Wait a few days after your sutures are removed before applying makeup. For skin adhesive strips:  Keep the wound clean and dry.  Do not get the skin adhesive strips wet. You may bathe carefully, using caution to keep the wound dry.  If the wound gets wet, pat it dry with a clean towel.  Skin adhesive strips will fall off on their own. You may trim the strips as the wound heals. Do not remove skin adhesive strips that are still stuck to the wound. They will fall off in time. For wound adhesive:  You may briefly wet your wound in the shower or bath. Do not soak or scrub the wound. Do not swim. Avoid periods of heavy perspiration until the skin adhesive has fallen off on its own. After showering or bathing, gently pat the wound dry with a clean towel.  Do not apply liquid medicine, cream medicine, ointment medicine, or makeup to your wound while the skin adhesive is in place. This may loosen the film before your wound is healed.  If a  dressing is placed over the wound, be careful not to apply tape directly over the skin adhesive. This may cause the adhesive to be pulled off before the wound is healed.  Avoid prolonged exposure to sunlight or tanning lamps while the skin adhesive is in place. Exposure to ultraviolet light in the first year will darken the scar.  The skin adhesive will usually remain in place for 5 to 10 days, then naturally fall off the skin. Do not pick at the adhesive film. You may need a tetanus shot if:  You cannot remember when you had your last tetanus shot.  You have never had a tetanus shot. If you get a tetanus shot, your arm may swell, get red, and feel warm to the touch. This is common and not a problem. If you need a tetanus shot and you choose not to have one, there is a rare chance of getting tetanus. Sickness from tetanus can be serious. SEEK IMMEDIATE MEDICAL CARE IF:  You develop redness, pain, or swelling around the wound.  There is yellowish-white fluid (pus) coming from the wound.  You  develop chills or a fever. MAKE SURE YOU:  Understand these instructions.  Will watch your condition.  Will get help right away if you are not doing well or get worse. Document Released: 11/30/2004 Document Revised: 01/15/2012 Document Reviewed: 04/17/2011 [facility] Patient Information 2014 [place], [place].

## 2013-07-18 NOTE — ED Provider Notes (Signed)
CSN: [id number]     Arrival date & time 07/18/13  1143 History   First MD Initiated Contact with Patient 07/18/13 1159     Chief Complaint  Patient presents with  . Fall   (Consider location/radiation/quality/duration/timing/severity/associated sxs/prior Treatment) HPI Comments: Pt is on plavix due to h/o CVA, has some memory problems.  Also had complications with a subdural that required evacuation after her stroke in 2012.  Pt lives in assisted living, she recalls that she got tripped up in a thick rug.  Pt reports some pain to right knee worse with movement, but was able to stand, bear weight and ambulate and bend knee well.  Patient is a 67 y.o. female presenting with head injury. The history is provided by the patient and a relative.  Head Injury Location:  Frontal Mechanism of injury: fall   Pain details:    Quality:  Dull   Radiates to:  Face   Severity:  Mild   Timing:  Constant   Progression:  Unchanged Chronicity:  New Relieved by:  Nothing Worsened by:  Nothing tried Ineffective treatments:  None tried Associated symptoms: headache   Associated symptoms: no blurred vision, no difficulty breathing, no disorientation, no double vision, no focal weakness, no loss of consciousness, no nausea, no neck pain, no numbness, no seizures and no vomiting     Past Medical History  Diagnosis Date  . Thyroid disease   . Diabetes mellitus   . High cholesterol   . Cholelithiasis   . Hypothyroidism   . 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 Depression     spouse died 1 month ago  . DEMENTIA     short term memory   Past Surgical History  Procedure Laterality Date  . Tubal ligation    . Wisdom tooth extraction    . Breast surgery      breast biopsy   . Tonsillectomy    . Subdrual hematoma  2000    as a result of the stroke   . Cholecystectomy  11/09/2011    Procedure: LAPAROSCOPIC CHOLECYSTECTOMY WITH INTRAOPERATIVE CHOLANGIOGRAM;  Surgeon: Velora Heckler, MD;  Location: [facility]  [facility];  Service: General;  Laterality: N/A;   History reviewed. No pertinent family history. History  Substance Use Topics  . Smoking status: Never Smoker   . Smokeless tobacco: Never Used  . Alcohol Use: No   OB History   Grav Para Term Preterm Abortions TAB SAB Ect Mult Living                 Review of Systems  HENT: Negative for neck pain and neck stiffness.   Eyes: Negative for blurred vision and double vision.  Respiratory: Negative for shortness of breath.   Cardiovascular: Negative for chest pain.  Gastrointestinal: Negative for nausea and vomiting.  Musculoskeletal: Positive for arthralgias. Negative for back pain.  Neurological: Positive for headaches. Negative for dizziness, focal weakness, seizures, loss of consciousness, weakness and numbness.  All other systems reviewed and are negative.    Allergies  Review of patient's allergies indicates no known allergies.  Home Medications   Current Outpatient Rx  Name  Route  Sig  Dispense  Refill  . acetaminophen (TYLENOL) 500 MG tablet   Oral   Take 500 mg by mouth every 6 (six) hours as needed for pain.         Marland Kitchen atorvastatin (LIPITOR) 20 MG tablet   Oral   Take 60 mg by mouth daily.         Marland Kitchen  buPROPion (WELLBUTRIN SR) 150 MG 12 hr tablet   Oral   Take 150 mg by mouth 2 (two) times daily.          . Cholecalciferol 1000 UNITS tablet   Oral   Take 1,000 Units by mouth daily.          . clopidogrel (PLAVIX) 75 MG tablet   Oral   Take 75 mg by mouth daily after breakfast.          . divalproex (DEPAKOTE) 125 MG DR tablet   Oral   Take 125 mg by mouth 2 (two) times daily.         Marland Kitchen levothyroxine (SYNTHROID, LEVOTHROID) 125 MCG tablet   Oral   Take 125 mcg by mouth daily at 6 (six) AM.          . LORazepam (ATIVAN) 0.5 MG tablet   Oral   Take 1 mg by mouth at bedtime.          . mineral oil external liquid   Both Ears   Place 2 drops into both ears once a week. On Wednesday           . Multiple Vitamins-Minerals (CERTA-VITE SENIOR-LUTEIN PO)   Oral   Take 1 tablet by mouth daily.         . sertraline (ZOLOFT) 50 MG tablet   Oral   Take 75 mg by mouth every evening.           BP 132/76  Pulse 80  Temp(Src) 98.7 F (37.1 C) (Oral)  Resp 17  SpO2 99% Physical Exam  Nursing note and vitals reviewed. Constitutional: She appears well-developed and well-nourished. No distress.  HENT:  Head: Normocephalic.    Mouth/Throat: Uvula is midline.    Eyes: Conjunctivae and EOM are normal. No scleral icterus.  Neck: Normal range of motion. Neck supple. No spinous process tenderness and no muscular tenderness present. Normal range of motion present. No Brudzinski's sign and no Kernig's sign noted.  Cardiovascular: Normal rate and intact distal pulses.   No murmur heard. Pulmonary/Chest: Effort normal. No respiratory distress. She has no wheezes.  Abdominal: Soft. She exhibits no distension. There is no tenderness.  Musculoskeletal: Normal range of motion. She exhibits tenderness.       Right hip: She exhibits no tenderness.       Left hip: She exhibits no tenderness.       Right knee: She exhibits normal range of motion, no swelling, no effusion, no ecchymosis and no laceration. Tenderness found.       Cervical back: She exhibits no tenderness.       Thoracic back: She exhibits no tenderness.       Lumbar back: She exhibits no tenderness.  Neurological: She is alert.  Skin: Skin is warm. She is not diaphoretic.    ED Course  LACERATION REPAIR Date/Time: 07/18/2013 2:08 PM Performed by: Lear Ng Authorized by: Lear Ng Consent: Verbal consent obtained. Risks and benefits: risks, benefits and alternatives were discussed Consent given by: patient Patient understanding: patient states understanding of the procedure being performed Patient consent: the patient's understanding of the procedure matches consent given Patient identity confirmed:  verbally with patient Time out: Immediately prior to procedure a "time out" was called to verify the correct patient, procedure, equipment, support staff and site/side marked as required. Body area: head/neck (bridge of nose) Laceration length: 1 cm Foreign bodies: no foreign bodies Tendon involvement: none Nerve involvement: none Irrigation  solution: saline Irrigation method: syringe Amount of cleaning: standard Debridement: none Degree of undermining: none Skin closure: glue Approximation: close Approximation difficulty: simple Patient tolerance: Patient tolerated the procedure well with no immediate complications.   (including critical care time) Labs Review Labs Reviewed - No data to display Imaging Review Dg Nasal Bones  07/18/2013   *RADIOLOGY REPORT*  Clinical Data: Fall.  Nasal injury  NASAL BONES - 3+ VIEW  Comparison: CT 07/08/2011  Findings: Negative for nasal bone fracture.  Prior craniotomy on the right is noted.  There is a stent overlying the right maxillary sinus.  IMPRESSION: Negative for nasal bone fracture.   Original Report Authenticated By: Janeece Riggers, M.D.   Ct Head Wo Contrast  07/18/2013   CLINICAL DATA:  Fall this morning, history of subdural hematoma, forehead abrasion  EXAM: CT HEAD WITHOUT CONTRAST  TECHNIQUE: Contiguous axial images were obtained from the base of the skull through the vertex without intravenous contrast.  COMPARISON:  07/08/2011  FINDINGS: Again noted status right posterior craniotomy atherosclerotic calcifications of carotid siphon again noted. The mastoid air cells are unremarkable.  No skull fracture is noted. Mild scalp swelling in right frontal region. Stable cerebral atrophy.  No intracranial hemorrhage, mass effect or midline shift. No acute infarction. Small lacunar infarct bilateral thalamus again noted. No mass lesion is noted on this unenhanced scan.  IMPRESSION: No acute intracranial abnormality. Again noted status post right  posterior or craniotomy. Stable cerebral atrophy. No acute cortical infarction. Stable small lacunar infarcts in bilateral thalamus.   Electronically Signed   By: Natasha Mead   On: 07/18/2013 13:53    ra sat is 96% and I interpret to be adequate   Results of studies reviewed with pt and family.  Pt's family mentioned that pt used to see Dr. Sandria Manly, has been trying to re-see [facility], that assisted facility has contacted them in the past.   2:16 PM Spoke to staff at [facility], have not received a referral from Dr. Redmond School.  Will let pt and family know.  MDM   1. Head injury without concussion or intracranial hemorrhage, initial encounter   2. Facial contusion, initial encounter   3. Facial laceration, initial encounter    Pt with mechanical fall with injury to forehead, face.  No blurred vision, EOMI.  Nose is deviated, small lac to bridge of nose with minimal bleeding, some facial contusion and abrasions.  No septal hematoma.  No facial bone instability.  Neck is non tender, no pain.  No focal deficits.  Will get head CT given h/o subdural, on plavix.  Also nasal films.  Otherwise pt declines any pain meds currently.      Gavin Pound. Joshus Rogan, MD 07/18/13 1416

## 2013-07-22 ENCOUNTER — Other Ambulatory Visit: Payer: Self-pay

## 2013-07-22 DIAGNOSIS — Z1231 Encounter for screening mammogram for malignant neoplasm of breast: Secondary | ICD-10-CM

## 2013-07-31 ENCOUNTER — Ambulatory Visit (INDEPENDENT_AMBULATORY_CARE_PROVIDER_SITE_OTHER): Payer: Medicare Other | Admitting: Neurology

## 2013-07-31 ENCOUNTER — Encounter: Payer: Self-pay | Admitting: Neurology

## 2013-07-31 VITALS — BP 131/72 | HR 99 | Ht 63.0 in | Wt 194.0 lb

## 2013-07-31 DIAGNOSIS — I6350 Cerebral infarction due to unspecified occlusion or stenosis of unspecified cerebral artery: Secondary | ICD-10-CM

## 2013-07-31 DIAGNOSIS — F09 Unspecified mental disorder due to known physiological condition: Secondary | ICD-10-CM

## 2013-07-31 DIAGNOSIS — R269 Unspecified abnormalities of gait and mobility: Secondary | ICD-10-CM | POA: Insufficient documentation

## 2013-07-31 DIAGNOSIS — I639 Cerebral infarction, unspecified: Secondary | ICD-10-CM

## 2013-07-31 DIAGNOSIS — R4189 Other symptoms and signs involving cognitive functions and awareness: Secondary | ICD-10-CM

## 2013-07-31 MED ORDER — RIVASTIGMINE 4.6 MG/24HR TD PT24: 1.0000 | MEDICATED_PATCH | Freq: Every day | TRANSDERMAL | Status: DC

## 2013-07-31 NOTE — Patient Instructions (Addendum)
Instructions provided on nursing home sheet  Our phone number is [phone]. We also have an after hours call service for urgent matters and there is a physician on-call for urgent questions. For any emergencies you know to call 911 or go to the nearest emergency room

## 2013-07-31 NOTE — Progress Notes (Signed)
[facility]  Provider:  Dr Hosie Poisson Referring Provider: Florentina Jenny, MD Primary Care Physician:  Florentina Jenny, MD  CC:  falls  HPI:  Brittney Moss is a 67 y.o. female here as a referral from Dr. 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 Majority of history is provided by patient's sister who reports the patient has had 4 falls in the past 6-8 months. These appear to be mechanical in nature, patient tripping over objects, likely probably do to poor attention. Patient denies feeling unsteady or weak. Typically will fall forward. These are occurring inside not when he is outside. She feels her legs are strong. She feels she is falling due to not paying attention. Overall feels better when she is walking. No lightheadedness, no dizziness no vertigo. Since her stroke she's had trouble with short term memory and attention. This has been stable with no worsening. She currently lives in assisted living facility, has done physical therapy recently in the past few months. They help with her walking and instability. She had a recent trip to the ER after suffering facial lacerations do to a fall. At that time had a head CT done which was stable with no acute change. Her memory is a head CT showed bilateral remote thalamic infarcts and evidence of a right posterior craniotomy.  Review of Systems: Out of a complete 14 system review, the patient complains of only the following symptoms, and all other reviewed systems are negative. Positive for weight gain fatigue memory loss  History   Social History  . Marital Status: Widowed    Spouse Name: N/A    Number of Children: N/A  . Years of Education: N/A   Occupational History  . Not on file.   Social History Main Topics  . Smoking status:  Never Smoker   . Smokeless tobacco: Never Used  . Alcohol Use: No  . Drug Use: No  . Sexual Activity: Not on file   Other Topics Concern  . Not on file   Social History Narrative   Patient is living in an assistant living.    Patient has a high school education.    Patient is a widow.    Patient does not work.     History reviewed. No pertinent family history.  Past Medical History  Diagnosis Date  . Thyroid disease   . Diabetes mellitus   . High cholesterol   . Cholelithiasis   . Hypothyroidism   . 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 Depression     spouse died 1 month ago  . DEMENTIA     short term memory    Past Surgical History  Procedure Laterality Date  . Tubal ligation    . Wisdom tooth extraction    . Breast surgery      breast biopsy   . Tonsillectomy    . Subdrual hematoma  2000    as a result of the stroke   . Cholecystectomy  11/09/2011    Procedure: LAPAROSCOPIC CHOLECYSTECTOMY WITH INTRAOPERATIVE CHOLANGIOGRAM;  Surgeon: Velora Heckler, MD;  Location: [facility];  Service: General;  Laterality: N/A;    Current Outpatient Prescriptions  Medication Sig Dispense Refill  . acetaminophen (TYLENOL) 500 MG tablet Take 500 mg by mouth every 6 (six) hours as needed for pain.      Marland Kitchen  atorvastatin (LIPITOR) 20 MG tablet Take 60 mg by mouth daily.      Marland Kitchen buPROPion (WELLBUTRIN SR) 150 MG 12 hr tablet Take 150 mg by mouth 2 (two) times daily.       . Cholecalciferol 1000 UNITS tablet Take 1,000 Units by mouth daily.       . clopidogrel (PLAVIX) 75 MG tablet Take 75 mg by mouth daily after breakfast.       . divalproex (DEPAKOTE) 125 MG DR tablet Take 125 mg by mouth 2 (two) times daily.      Marland Kitchen levothyroxine (SYNTHROID, LEVOTHROID) 125 MCG tablet Take 125 mcg by mouth daily at 6 (six) AM.       . LORazepam (ATIVAN) 0.5 MG tablet Take 1 mg by mouth at bedtime.       . mineral oil external liquid Place 2 drops into both ears once a week. On Wednesday      . Multiple  Vitamins-Minerals (CERTA-VITE SENIOR-LUTEIN PO) Take 1 tablet by mouth daily.      . sertraline (ZOLOFT) 50 MG tablet Take 75 mg by mouth every evening.        No current facility-administered medications for this visit.    Allergies as of 07/31/2013  . (No Known Allergies)    Vitals: BP 131/72  Pulse 99  Ht 5\' 3"  (1.6 m)  Wt 194 lb (87.998 kg)  BMI 34.37 kg/m2 Last Weight:  Wt Readings from Last 1 Encounters:  07/31/13 194 lb (87.998 kg)   Last Height:   Ht Readings from Last 1 Encounters:  07/31/13 5\' 3"  (1.6 m)     Physical exam: Exam: Gen: NAD, conversant Eyes: anicteric sclerae, moist conjunctivae HENT: Atraumatic Neck: Trachea midline; supple,  Lungs: CTA, no wheezing, rales, rhonic                          CV: RRR, no MRG Abdomen: Soft, non-tender;  Extremities: No peripheral edema  Skin: Normal temperature, no rash,  Psych: Appropriate affect, pleasant  Neuro: Brittney: AA&Ox2(incorrect date), difficulty with current events, appropriately interactive, normal affect  Attention: WORLD backwards, some difficulty with muli-step commands  Speech: fluent w/o paraphasic error  CN: PERRL, EOMI no nystagmus, mild L ptosis, sensation intact to LT V1-V3 bilat, face symmetric, no weakness, hearing grossly intact, palate elevates symmetrically, shoulder shrug 5/5 bilat,  tongue protrudes midline, no fasiculations noted.  Motor: normal bulk and tone Strength: 5/5  In all extremities  Coord: rapid alternating and point-to-point (FNF, HTS) movements intact.  Reflexes: symmetrical, bilat downgoing toes  Sens: LT intact in all extremities  Gait: slow, slightly wide based, did not attempt tandem, negative Rhomberg, falls backwards with pull test (suspect 2/2 impaired understanding of test)   Assessment:  After physical and neurologic examination, review of laboratory studies, imaging, neurophysiology testing and pre-existing records, assessment will be reviewed on the  problem list.  Plan:  Treatment plan and additional workup will be reviewed under Problem List.  1)gait disorder 2)Cognitive decline 3)Ischemic infarct  4)Hx SDH, s/p craniotomy  Brittney Moss is a doesn't 67 year old woman with a history of bilateral thalamic infarcts with subsequent subdural hematoma status post craniotomy presenting for evaluation of gait instability and falls. Her sister also notes difficulty with concentration and short-term memory, which has been ongoing since the infarct. Patient recently had a severe fall which resulted in facial lacerations and a trip to the ER. At that time a head CT was done  which was stable. Suspect patient's falls are likely related to poor concentration and awareness. Will start patient on low-dose Exelon patch with goal of improving cognition and gait stability.Will continue on Lipitor and Plavix for stroke PPX.  Follow up in 6 months

## 2013-08-13 ENCOUNTER — Ambulatory Visit
Admission: RE | Admit: 2013-08-13 | Discharge: 2013-08-13 | Disposition: A | Discharge status: Home / Self Care | Payer: Medicare Other | Source: Ambulatory Visit

## 2013-08-13 DIAGNOSIS — Z1231 Encounter for screening mammogram for malignant neoplasm of breast: Secondary | ICD-10-CM

## 2013-08-30 ENCOUNTER — Ambulatory Visit (HOSPITAL_COMMUNITY): Payer: Medicare Other | Attending: Emergency Medicine

## 2013-08-30 ENCOUNTER — Emergency Department (INDEPENDENT_AMBULATORY_CARE_PROVIDER_SITE_OTHER)
Admission: EM | Admit: 2013-08-30 | Discharge: 2013-08-30 | Disposition: A | Discharge status: Home / Self Care | Payer: Medicare Other | Source: Home / Self Care | Attending: Emergency Medicine | Admitting: Emergency Medicine

## 2013-08-30 ENCOUNTER — Ambulatory Visit (HOSPITAL_COMMUNITY): Payer: Medicare Other

## 2013-08-30 ENCOUNTER — Encounter (HOSPITAL_COMMUNITY): Payer: Self-pay | Admitting: Emergency Medicine

## 2013-08-30 DIAGNOSIS — S63501A Unspecified sprain of right wrist, initial encounter: Secondary | ICD-10-CM

## 2013-08-30 DIAGNOSIS — W010XXA Fall on same level from slipping, tripping and stumbling without subsequent striking against object, initial encounter: Secondary | ICD-10-CM | POA: Insufficient documentation

## 2013-08-30 DIAGNOSIS — S63509A Unspecified sprain of unspecified wrist, initial encounter: Secondary | ICD-10-CM

## 2013-08-30 DIAGNOSIS — M79609 Pain in unspecified limb: Secondary | ICD-10-CM | POA: Insufficient documentation

## 2013-08-30 MED ORDER — HYDROCODONE-ACETAMINOPHEN 5-325 MG PO TABS
1.0000 | ORAL_TABLET | Freq: Once | ORAL | Status: AC
  Administered 2013-08-30: 1 via ORAL

## 2013-08-30 MED ORDER — HYDROCODONE-ACETAMINOPHEN 5-325 MG PO TABS
ORAL_TABLET | ORAL | Status: AC
  Filled 2013-08-30: qty 1

## 2013-08-30 NOTE — ED Notes (Signed)
Reported fall earlier today , w fall , c/o pain in hand and wrist. Denies other injury

## 2013-08-30 NOTE — ED Notes (Signed)
Patient transported to X-ray 

## 2013-08-30 NOTE — ED Provider Notes (Signed)
Chief Complaint:   Chief Complaint  Patient presents with  . Fall    History of Present Illness:   Brittney Moss is a 67 year old female who lives in a group home because of dementia, status post CVA. She bent over to pick something up off the ground and lost her balance, fell, and caught her self on her right, outstretched hand. Ever since then she's had pain in the hand and the wrist. There is no swelling or bruising or deformity. She has diminished range of motion with pain, no numbness or tingling. She denies any head injury, loss of consciousness, or other obvious injuries.  Review of Systems:  Other than noted above, the patient denies any of the following symptoms: Systemic:  No fevers, chills, or sweats.  No fatigue or tiredness. Musculoskeletal:  No joint pain, arthritis, bursitis, swelling, back pain, or neck pain.  Neurological:  No muscular weakness, paresthesias.  PMFSH:  Past medical history, family history, social history, meds, and allergies were reviewed.  She takes Lipitor, Wellbutrin, Plavix, Depakote, Synthroid, Ativan, Exelon, and Zoloft. She has a history of thyroid disease, diabetes, high cholesterol, stroke, depression, and dementia. She has no known allergies.  Physical Exam:   Vital signs:  BP 130/71  Pulse 80  Temp(Src) 98.1 F (36.7 C) (Oral)  Resp 18  SpO2 96% Gen:  Alert and oriented times 3.  In no distress. Musculoskeletal:  Exam of the hand reveals there is pain to palpation over the anatomical snuff box area. There is no pain to palpation over the remainder of the wrist. No swelling, bruising, or deformity. She does have pain to palpation over the palm of the hand, overlying the metacarpals. The wrist has a diminished range of motion with pain. Fingers have full range of motion with mild pain.  Otherwise, all joints had a full a ROM with no swelling, bruising or deformity.  No edema, pulses full. Extremities were warm and pink.  Capillary refill was brisk.   Skin:  Clear, warm and dry.  No rash. Neuro:  Alert and oriented times 3.  Muscle strength was normal.  Sensation was intact to light touch.   Radiology:  Dg Wrist Navic Only Right  08/30/2013   CLINICAL DATA:  Fall on outstretched hand.  Wrist pain.  EXAM: DG WRIST NAVIC ONLY RIGHT  COMPARISON:  None.  FINDINGS: Scaphoid bone appears intact. No fracture is identified. 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  IMPRESSION: Negative.   Electronically Signed   By: Andreas Newport M.D.   On: 08/30/2013 20:43   Dg Hand Complete Right  08/30/2013   CLINICAL DATA:  Fall on outstretched hand. Right hand pain.  EXAM: RIGHT HAND - COMPLETE 3+ VIEW  COMPARISON:  None.  FINDINGS: Anatomic alignment of bones of the hand. 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 Mild index finger MCP joint osteoarthritis. There is no fracture or radiopaque foreign body. Distal radius and ulna appear normal. Marginal osteophyte appears present adjacent to the distal radial articular surface by the distal radioulnar joint.  IMPRESSION: No acute osseous abnormality.   Electronically Signed   By: Andreas Newport M.D.   On: 08/30/2013 20:43   I reviewed the images independently and personally and concur with the radiologist's findings.  Course in Urgent Care Center:   Placed in a thumb spica splint. Given Norco 5/325 one by mouth for pain.  Assessment:  The encounter diagnosis was Wrist  sprain, right, initial encounter.  No evidence of fracture. She is tender or the anatomical snuff box area, so we'll immobilize the joint and have her followup with orthopedics in one week.  Plan:   1.  Meds:  The following meds were prescribed:   Discharge Medication List as of 08/30/2013  8:58 PM      2.  Patient Education/Counseling:  The patient was given appropriate handouts, self care instructions, and instructed in symptomatic relief, including rest and activity, elevation,  application of ice and compression. She'll wear the thumb spica splint continuously except to bathe or shower for the next week.  3.  Follow up:  The patient was told to follow up if no better in 3 to 4 days, if becoming worse in any way, and given some red flag symptoms such as worsening pain or new neurological symptoms which would prompt immediate return.  Follow up with Dr. Jene Every in 1 week.      Reuben Likes, MD 08/30/13 2124

## 2013-10-09 ENCOUNTER — Telehealth: Payer: Self-pay | Admitting: Neurology

## 2014-01-29 ENCOUNTER — Encounter (INDEPENDENT_AMBULATORY_CARE_PROVIDER_SITE_OTHER): Payer: Self-pay

## 2014-01-29 ENCOUNTER — Ambulatory Visit (INDEPENDENT_AMBULATORY_CARE_PROVIDER_SITE_OTHER): Payer: Medicare Other | Admitting: Neurology

## 2014-01-29 ENCOUNTER — Encounter: Payer: Self-pay | Admitting: Neurology

## 2014-01-29 VITALS — BP 115/76 | HR 102 | Ht 63.5 in | Wt 192.0 lb

## 2014-01-29 DIAGNOSIS — R269 Unspecified abnormalities of gait and mobility: Secondary | ICD-10-CM

## 2014-01-29 DIAGNOSIS — I639 Cerebral infarction, unspecified: Secondary | ICD-10-CM

## 2014-01-29 DIAGNOSIS — R4189 Other symptoms and signs involving cognitive functions and awareness: Secondary | ICD-10-CM

## 2014-01-29 DIAGNOSIS — F09 Unspecified mental disorder due to known physiological condition: Secondary | ICD-10-CM

## 2014-01-29 DIAGNOSIS — I6350 Cerebral infarction due to unspecified occlusion or stenosis of unspecified cerebral artery: Secondary | ICD-10-CM

## 2014-01-29 NOTE — Patient Instructions (Signed)
Instructions given on NH paperwork 

## 2014-01-29 NOTE — Progress Notes (Signed)
[facility]  Provider:  Dr Hosie PoissonSumner Referring Provider: Florentina Jennyripp, Henry, MD Primary Care Physician:  Florentina JennyRIPP, HENRY, MD  CC:  falls  HPI:  Brittney Moss is a 68 y.o. female here as a follow up from Dr. Redmond Schoolripp for recurrent falls. Last visit was 07/2013 at which time she was started on Exelon 4.6mg  daily. They have not noticed any change in her memory or walking with the patch. She continues to have difficulty with short term memory. They state that her walking is going well, they deny any recent trips or falls. Currently taking Plavix and Crestor for stroke prevention.   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 Majority of history is provided by patient's sister who reports the patient has had 4 falls in the past 6-8 months. These appear to be mechanical in nature, patient tripping over objects, likely probably do to poor attention. Patient denies feeling unsteady or weak. Typically will fall forward. These are occurring inside not when he is outside. She feels her legs are strong. She feels she is falling due to not paying attention. Overall feels better when she is walking. No lightheadedness, no dizziness no vertigo. Since her stroke she's had trouble with short term memory and attention. This has been stable with no worsening. She currently lives in assisted living facility, has done physical therapy recently in the past few months. They help with her walking and instability. She had a recent trip to the ER after suffering facial lacerations do to a fall. At that time had a head CT done which was stable with no acute change. Her memory is a head CT showed bilateral remote thalamic infarcts and evidence of a right posterior craniotomy.  Review of Systems: Out of a complete 14 system review, the patient  complains of only the following symptoms, and all other reviewed systems are negative.  Positive for weight gain fatigue memory loss  History   Social History  . Marital Status: Widowed    Spouse Name: N/A    Number of Children: N/A  . Years of Education: N/A   Occupational History  . Not on file.   Social History Main Topics  . Smoking status: Never Smoker   . Smokeless tobacco: Never Used  . Alcohol Use: No  . Drug Use: No  . Sexual Activity: Not on file   Other Topics Concern  . Not on file   Social History Narrative   Patient is living in an assistant living.    Patient has a high school education.    Patient is a widow.    Patient does not work.     No family history on file.  Past Medical History  Diagnosis Date  . Thyroid disease   . Diabetes mellitus   . High cholesterol   . Cholelithiasis   . Hypothyroidism   . 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 Depression     spouse died 1 month ago  . DEMENTIA     short term memory    Past Surgical History  Procedure Laterality Date  . Tubal ligation    . Wisdom tooth extraction    . Breast surgery      breast biopsy   . Tonsillectomy    . Subdrual hematoma  2000    as a result of  the stroke   . Cholecystectomy  11/09/2011    Procedure: LAPAROSCOPIC CHOLECYSTECTOMY WITH INTRAOPERATIVE CHOLANGIOGRAM;  Surgeon: Velora Heckler, MD;  Location: [facility];  Service: General;  Laterality: N/A;    Current Outpatient Prescriptions  Medication Sig Dispense Refill  . acetaminophen (TYLENOL) 500 MG tablet Take 500 mg by mouth every 6 (six) hours as needed for pain.      Marland Kitchen buPROPion (WELLBUTRIN SR) 150 MG 12 hr tablet Take 150 mg by mouth 2 (two) times daily.       . Cholecalciferol 1000 UNITS tablet Take 1,000 Units by mouth daily.       . clopidogrel (PLAVIX) 75 MG tablet Take 75 mg by mouth daily after breakfast.       . CRESTOR 20 MG tablet       . divalproex (DEPAKOTE) 125 MG DR tablet Take 125 mg by mouth 2 (two)  times daily.      . enalapril (VASOTEC) 5 MG tablet       . levothyroxine (SYNTHROID, LEVOTHROID) 125 MCG tablet Take 125 mcg by mouth daily at 6 (six) AM.       . LORazepam (ATIVAN) 0.5 MG tablet Take 1 mg by mouth at bedtime.       . mineral oil external liquid Place 2 drops into both ears once a week. On Wednesday      . Multiple Vitamins-Minerals (CERTA-VITE SENIOR-LUTEIN PO) Take 1 tablet by mouth daily.      . sertraline (ZOLOFT) 50 MG tablet Take 75 mg by mouth every evening.        No current facility-administered medications for this visit.    Allergies as of 01/29/2014  . (No Known Allergies)    Vitals: BP 115/76  Pulse 102  Ht 5' 3.5" (1.613 m)  Wt 192 lb (87.091 kg)  BMI 33.47 kg/m2 Last Weight:  Wt Readings from Last 1 Encounters:  01/29/14 192 lb (87.091 kg)   Last Height:   Ht Readings from Last 1 Encounters:  01/29/14 5' 3.5" (1.613 m)     Physical exam: Exam: Gen: NAD, conversant Eyes: anicteric sclerae, moist conjunctivae HENT: Atraumatic Neck: Trachea midline; supple,  Lungs: CTA, no wheezing, rales, rhonic                          CV: RRR, no MRG Abdomen: Soft, non-tender;  Extremities: No peripheral edema  Skin: Normal temperature, no rash,  Psych: Appropriate affect, pleasant  Neuro: Brittney: AA&Ox2(incorrect date), difficulty with current events, appropriately interactive, normal affect  Attention: WORLD backwards, some difficulty with muli-step commands  Speech: fluent w/o paraphasic error  CN: PERRL, EOMI no nystagmus, mild L ptosis, sensation intact to LT V1-V3 bilat, face symmetric, no weakness, hearing grossly intact, palate elevates symmetrically, shoulder shrug 5/5 bilat,  tongue protrudes midline, no fasiculations noted.  Motor: normal bulk and tone Strength: 5/5  In all extremities  Coord: rapid alternating and point-to-point (FNF, HTS) movements intact.  Reflexes: symmetrical, bilat downgoing toes  Sens: LT intact in all  extremities  Gait: slow, slightly wide based, unsteady with tandem walking. Wobbles with Romberg but does not fall   Assessment:  After physical and neurologic examination, review of laboratory studies, imaging, neurophysiology testing and pre-existing records, assessment will be reviewed on the problem list.  Plan:  Treatment plan and additional workup will be reviewed under Problem List.  1)gait disorder 2)Cognitive decline 3)Ischemic infarct  4)Hx SDH, s/p craniotomy  Brittney Moss is a pleasant 68 year old woman with a history of bilateral thalamic infarcts with subsequent subdural hematoma status post craniotomy presenting for follow up of gait instability and falls. Her sister also notes difficulty with concentration and short-term memory, which has been ongoing since the infarct. At last visit was started on Exelon with no noted improvement in memory. Walking appears to have improved, no recent falls. Will discontinue Exelon at this time due to perceived lack of benefit and cost. If worsening noted, will plan to restart. Continue on Plavix and statin for stroke prevention. Follow up in 6 months.

## 2014-04-29 ENCOUNTER — Ambulatory Visit (HOSPITAL_BASED_OUTPATIENT_CLINIC_OR_DEPARTMENT_OTHER)
Admission: RE | Admit: 2014-04-29 | Discharge: 2014-04-30 | Disposition: A | Discharge status: Home / Self Care | Payer: Medicare Other | Source: Ambulatory Visit | Attending: Orthopedic Surgery | Admitting: Orthopedic Surgery

## 2014-04-29 ENCOUNTER — Encounter (HOSPITAL_COMMUNITY)
Admission: RE | Disposition: A | Discharge status: Home / Self Care | Payer: Self-pay | Source: Ambulatory Visit | Attending: Orthopedic Surgery

## 2014-04-29 ENCOUNTER — Ambulatory Visit (HOSPITAL_BASED_OUTPATIENT_CLINIC_OR_DEPARTMENT_OTHER): Payer: Medicare Other | Admitting: Anesthesiology

## 2014-04-29 ENCOUNTER — Other Ambulatory Visit: Payer: Self-pay

## 2014-04-29 ENCOUNTER — Encounter (HOSPITAL_BASED_OUTPATIENT_CLINIC_OR_DEPARTMENT_OTHER): Payer: Medicare Other | Admitting: Anesthesiology

## 2014-04-29 ENCOUNTER — Encounter (HOSPITAL_BASED_OUTPATIENT_CLINIC_OR_DEPARTMENT_OTHER): Payer: Self-pay | Admitting: *Deleted

## 2014-04-29 DIAGNOSIS — E785 Hyperlipidemia, unspecified: Secondary | ICD-10-CM | POA: Diagnosis not present

## 2014-04-29 DIAGNOSIS — S52502A Unspecified fracture of the lower end of left radius, initial encounter for closed fracture: Secondary | ICD-10-CM

## 2014-04-29 DIAGNOSIS — I69998 Other sequelae following unspecified cerebrovascular disease: Secondary | ICD-10-CM | POA: Insufficient documentation

## 2014-04-29 DIAGNOSIS — R413 Other amnesia: Secondary | ICD-10-CM | POA: Insufficient documentation

## 2014-04-29 DIAGNOSIS — W19XXXA Unspecified fall, initial encounter: Secondary | ICD-10-CM | POA: Insufficient documentation

## 2014-04-29 DIAGNOSIS — S52599A Other fractures of lower end of unspecified radius, initial encounter for closed fracture: Secondary | ICD-10-CM | POA: Diagnosis not present

## 2014-04-29 DIAGNOSIS — E039 Hypothyroidism, unspecified: Secondary | ICD-10-CM | POA: Insufficient documentation

## 2014-04-29 DIAGNOSIS — E119 Type 2 diabetes mellitus without complications: Secondary | ICD-10-CM | POA: Insufficient documentation

## 2014-04-29 DIAGNOSIS — M129 Arthropathy, unspecified: Secondary | ICD-10-CM | POA: Insufficient documentation

## 2014-04-29 HISTORY — DX: Unspecified sequelae of cerebral infarction: I69.30

## 2014-04-29 HISTORY — PX: OPEN REDUCTION INTERNAL FIXATION (ORIF) DISTAL RADIAL FRACTURE: SHX5989

## 2014-04-29 HISTORY — DX: Unspecified fracture of unspecified forearm, initial encounter for closed fracture: S52.90XA

## 2014-04-29 HISTORY — DX: Other amnesia: R41.3

## 2014-04-29 HISTORY — DX: Type 2 diabetes mellitus without complications: E11.9

## 2014-04-29 HISTORY — DX: Unspecified abnormalities of gait and mobility: R26.9

## 2014-04-29 HISTORY — DX: Hyperlipidemia, unspecified: E78.5

## 2014-04-29 LAB — POCT I-STAT, CHEM 8
BUN: 19 mg/dL (ref 6–23)
CHLORIDE: 103 meq/L (ref 96–112)
Calcium, Ion: 1.27 mmol/L (ref 1.13–1.30)
Creatinine, Ser: 0.8 mg/dL (ref 0.50–1.10)
Glucose, Bld: 179 mg/dL — ABNORMAL HIGH (ref 70–99)
HEMATOCRIT: 43 % (ref 36.0–46.0)
HEMOGLOBIN: 14.6 g/dL (ref 12.0–15.0)
POTASSIUM: 4.3 meq/L (ref 3.7–5.3)
SODIUM: 141 meq/L (ref 137–147)
TCO2: 26 mmol/L (ref 0–100)

## 2014-04-29 SURGERY — OPEN REDUCTION INTERNAL FIXATION (ORIF) DISTAL RADIUS FRACTURE
Anesthesia: General | Site: Arm Lower | Laterality: Left

## 2014-04-29 MED ORDER — MIDAZOLAM HCL 5 MG/5ML IJ SOLN
INTRAMUSCULAR | Status: DC | PRN
  Administered 2014-04-29: 1 mg via INTRAVENOUS

## 2014-04-29 MED ORDER — ONDANSETRON HCL 4 MG/2ML IJ SOLN
4.0000 mg | Freq: Four times a day (QID) | INTRAMUSCULAR | Status: DC | PRN
  Filled 2014-04-29: qty 2

## 2014-04-29 MED ORDER — LIDOCAINE HCL (CARDIAC) 20 MG/ML IV SOLN
INTRAVENOUS | Status: DC | PRN
  Administered 2014-04-29: 80 mg via INTRAVENOUS

## 2014-04-29 MED ORDER — LEVOTHYROXINE SODIUM 125 MCG PO TABS
125.0000 ug | ORAL_TABLET | Freq: Every day | ORAL | Status: DC
  Administered 2014-04-30: 125 ug via ORAL
  Filled 2014-04-29 (×3): qty 1

## 2014-04-29 MED ORDER — DEXAMETHASONE SODIUM PHOSPHATE 10 MG/ML IJ SOLN
INTRAMUSCULAR | Status: DC | PRN
  Administered 2014-04-29: 10 mg via INTRAVENOUS

## 2014-04-29 MED ORDER — ADULT MULTIVITAMIN W/MINERALS CH
1.0000 | ORAL_TABLET | Freq: Every day | ORAL | Status: DC
  Administered 2014-04-30: 1 via ORAL
  Filled 2014-04-29 (×2): qty 1

## 2014-04-29 MED ORDER — FENTANYL CITRATE 0.05 MG/ML IJ SOLN
25.0000 ug | INTRAMUSCULAR | Status: DC | PRN
  Administered 2014-04-29 (×4): 50 ug via INTRAVENOUS
  Filled 2014-04-29: qty 1

## 2014-04-29 MED ORDER — BUPIVACAINE HCL 0.25 % IJ SOLN
INTRAMUSCULAR | Status: DC | PRN
  Administered 2014-04-29: 18 mL

## 2014-04-29 MED ORDER — VITAMIN C 500 MG PO TABS: 500.0000 mg | ORAL_TABLET | Freq: Every day | ORAL | Status: DC

## 2014-04-29 MED ORDER — KCL IN DEXTROSE-NACL 20-5-0.45 MEQ/L-%-% IV SOLN
INTRAVENOUS | Status: DC
  Filled 2014-04-29: qty 1000

## 2014-04-29 MED ORDER — PROMETHAZINE HCL 25 MG/ML IJ SOLN
6.2500 mg | INTRAMUSCULAR | Status: DC | PRN
  Filled 2014-04-29: qty 1

## 2014-04-29 MED ORDER — CHLORHEXIDINE GLUCONATE 4 % EX LIQD
60.0000 mL | Freq: Once | CUTANEOUS | Status: DC
  Filled 2014-04-29: qty 60

## 2014-04-29 MED ORDER — LORAZEPAM 1 MG PO TABS
1.0000 mg | ORAL_TABLET | Freq: Every day | ORAL | Status: DC
  Administered 2014-04-29: 1 mg via ORAL
  Filled 2014-04-29 (×2): qty 1

## 2014-04-29 MED ORDER — BUPROPION HCL ER (SR) 150 MG PO TB12
150.0000 mg | ORAL_TABLET | Freq: Two times a day (BID) | ORAL | Status: DC
  Administered 2014-04-29 – 2014-04-30 (×3): 150 mg via ORAL
  Filled 2014-04-29 (×5): qty 1

## 2014-04-29 MED ORDER — ZOLPIDEM TARTRATE 5 MG PO TABS
5.0000 mg | ORAL_TABLET | Freq: Every evening | ORAL | Status: DC | PRN
  Filled 2014-04-29: qty 1

## 2014-04-29 MED ORDER — OXYCODONE-ACETAMINOPHEN 5-325 MG PO TABS: 1.0000 | ORAL_TABLET | ORAL | Status: DC | PRN

## 2014-04-29 MED ORDER — CLOPIDOGREL BISULFATE 75 MG PO TABS
75.0000 mg | ORAL_TABLET | Freq: Every day | ORAL | Status: DC
  Administered 2014-04-30: 75 mg via ORAL
  Filled 2014-04-29 (×2): qty 1

## 2014-04-29 MED ORDER — LACTATED RINGERS IV SOLN
INTRAVENOUS | Status: DC
  Administered 2014-04-29 (×2): via INTRAVENOUS
  Filled 2014-04-29: qty 1000

## 2014-04-29 MED ORDER — ATORVASTATIN CALCIUM 40 MG PO TABS
40.0000 mg | ORAL_TABLET | Freq: Every day | ORAL | Status: DC
  Administered 2014-04-29: 40 mg via ORAL
  Filled 2014-04-29 (×3): qty 1

## 2014-04-29 MED ORDER — ENALAPRIL MALEATE 5 MG PO TABS
5.0000 mg | ORAL_TABLET | Freq: Every day | ORAL | Status: DC
  Administered 2014-04-29 – 2014-04-30 (×2): 5 mg via ORAL
  Filled 2014-04-29 (×3): qty 1

## 2014-04-29 MED ORDER — DOCUSATE SODIUM 100 MG PO CAPS
100.0000 mg | ORAL_CAPSULE | Freq: Two times a day (BID) | ORAL | Status: DC
  Administered 2014-04-29 – 2014-04-30 (×3): 100 mg via ORAL
  Filled 2014-04-29 (×5): qty 1

## 2014-04-29 MED ORDER — METHOCARBAMOL 500 MG PO TABS
500.0000 mg | ORAL_TABLET | Freq: Four times a day (QID) | ORAL | Status: DC | PRN
  Administered 2014-04-30: 500 mg via ORAL
  Filled 2014-04-29 (×2): qty 1

## 2014-04-29 MED ORDER — FENTANYL CITRATE 0.05 MG/ML IJ SOLN
INTRAMUSCULAR | Status: DC | PRN
  Administered 2014-04-29: 25 ug via INTRAVENOUS
  Administered 2014-04-29 (×3): 50 ug via INTRAVENOUS
  Administered 2014-04-29: 25 ug via INTRAVENOUS

## 2014-04-29 MED ORDER — PHENYLEPHRINE HCL 10 MG/ML IJ SOLN
10.0000 mg | INTRAVENOUS | Status: DC | PRN
  Administered 2014-04-29: 25 ug/min via INTRAVENOUS

## 2014-04-29 MED ORDER — ONDANSETRON HCL 4 MG/2ML IJ SOLN
INTRAMUSCULAR | Status: DC | PRN
  Administered 2014-04-29: 4 mg via INTRAVENOUS

## 2014-04-29 MED ORDER — DIVALPROEX SODIUM 125 MG PO DR TAB
125.0000 mg | DELAYED_RELEASE_TABLET | Freq: Two times a day (BID) | ORAL | Status: DC
  Administered 2014-04-29 – 2014-04-30 (×3): 125 mg via ORAL
  Filled 2014-04-29 (×5): qty 1

## 2014-04-29 MED ORDER — KCL IN DEXTROSE-NACL 20-5-0.9 MEQ/L-%-% IV SOLN
INTRAVENOUS | Status: DC
  Administered 2014-04-29: 18:00:00 via INTRAVENOUS
  Filled 2014-04-29 (×2): qty 1000

## 2014-04-29 MED ORDER — MEPERIDINE HCL 25 MG/ML IJ SOLN
6.2500 mg | INTRAMUSCULAR | Status: DC | PRN
  Filled 2014-04-29: qty 1

## 2014-04-29 MED ORDER — PROPOFOL INFUSION 10 MG/ML OPTIME
INTRAVENOUS | Status: DC | PRN
  Administered 2014-04-29: 150 mL via INTRAVENOUS
  Administered 2014-04-29: 100 mL via INTRAVENOUS

## 2014-04-29 MED ORDER — FENTANYL CITRATE 0.05 MG/ML IJ SOLN
INTRAMUSCULAR | Status: AC
  Filled 2014-04-29: qty 2

## 2014-04-29 MED ORDER — CEFAZOLIN SODIUM 1-5 GM-% IV SOLN
1.0000 g | Freq: Three times a day (TID) | INTRAVENOUS | Status: DC
  Administered 2014-04-29 – 2014-04-30 (×2): 1 g via INTRAVENOUS
  Filled 2014-04-29 (×3): qty 50

## 2014-04-29 MED ORDER — METHOCARBAMOL 1000 MG/10ML IJ SOLN
500.0000 mg | Freq: Four times a day (QID) | INTRAVENOUS | Status: DC | PRN
  Filled 2014-04-29: qty 5

## 2014-04-29 MED ORDER — FENTANYL CITRATE 0.05 MG/ML IJ SOLN
INTRAMUSCULAR | Status: AC
  Filled 2014-04-29: qty 4

## 2014-04-29 MED ORDER — CEFAZOLIN SODIUM-DEXTROSE 2-3 GM-% IV SOLR
2.0000 g | INTRAVENOUS | Status: AC
  Administered 2014-04-29: 2 g via INTRAVENOUS
  Filled 2014-04-29: qty 50

## 2014-04-29 MED ORDER — VITAMIN C 500 MG PO TABS
1000.0000 mg | ORAL_TABLET | Freq: Every day | ORAL | Status: DC
  Administered 2014-04-29 – 2014-04-30 (×2): 1000 mg via ORAL
  Filled 2014-04-29 (×3): qty 2

## 2014-04-29 MED ORDER — OXYCODONE-ACETAMINOPHEN 5-325 MG PO TABS
1.0000 | ORAL_TABLET | ORAL | Status: DC | PRN
  Filled 2014-04-29: qty 2

## 2014-04-29 MED ORDER — DOCUSATE SODIUM 100 MG PO CAPS: 100.0000 mg | ORAL_CAPSULE | Freq: Two times a day (BID) | ORAL | Status: DC

## 2014-04-29 MED ORDER — CEFAZOLIN SODIUM 1-5 GM-% IV SOLN
1.0000 g | INTRAVENOUS | Status: DC
  Filled 2014-04-29: qty 50

## 2014-04-29 MED ORDER — ONDANSETRON HCL 4 MG PO TABS
4.0000 mg | ORAL_TABLET | Freq: Four times a day (QID) | ORAL | Status: DC | PRN
  Filled 2014-04-29: qty 1

## 2014-04-29 MED ORDER — SERTRALINE HCL 50 MG PO TABS
75.0000 mg | ORAL_TABLET | Freq: Every evening | ORAL | Status: DC
  Administered 2014-04-29: 75 mg via ORAL
  Filled 2014-04-29 (×3): qty 1

## 2014-04-29 MED ORDER — DIPHENHYDRAMINE HCL 25 MG PO CAPS
25.0000 mg | ORAL_CAPSULE | Freq: Four times a day (QID) | ORAL | Status: DC | PRN
  Filled 2014-04-29: qty 2

## 2014-04-29 MED ORDER — MIDAZOLAM HCL 2 MG/2ML IJ SOLN
INTRAMUSCULAR | Status: AC
  Filled 2014-04-29: qty 2

## 2014-04-29 MED ORDER — CEFAZOLIN SODIUM 1-5 GM-% IV SOLN
1.0000 g | Freq: Three times a day (TID) | INTRAVENOUS | Status: DC
  Filled 2014-04-29: qty 50

## 2014-04-29 MED ORDER — HYDROCODONE-ACETAMINOPHEN 7.5-325 MG PO TABS
1.0000 | ORAL_TABLET | ORAL | Status: DC | PRN
  Administered 2014-04-29: 2 via ORAL
  Administered 2014-04-29 – 2014-04-30 (×3): 1 via ORAL
  Administered 2014-04-30: 2 via ORAL
  Filled 2014-04-29: qty 2
  Filled 2014-04-29 (×2): qty 1
  Filled 2014-04-29: qty 2
  Filled 2014-04-29: qty 1
  Filled 2014-04-29: qty 2

## 2014-04-29 MED ORDER — HYDROMORPHONE HCL PF 1 MG/ML IJ SOLN
0.5000 mg | INTRAMUSCULAR | Status: DC | PRN
  Administered 2014-04-29: 0.5 mg via INTRAVENOUS
  Filled 2014-04-29 (×2): qty 1

## 2014-04-29 MED ORDER — ALPRAZOLAM 0.5 MG PO TABS
0.5000 mg | ORAL_TABLET | Freq: Four times a day (QID) | ORAL | Status: DC | PRN
  Filled 2014-04-29: qty 1

## 2014-04-29 SURGICAL SUPPLY — 67 items
1.6 K WIRE ×1 IMPLANT
BANDAGE ELASTIC 4 VELCRO ST LF (GAUZE/BANDAGES/DRESSINGS) ×3 IMPLANT
BANDAGE GAUZE ELAST BULKY 4 IN (GAUZE/BANDAGES/DRESSINGS) ×1 IMPLANT
BIT DRILL 2.2 SS TIBIAL (BIT) ×1 IMPLANT
BLADE SURG 15 STRL LF DISP TIS (BLADE) ×1 IMPLANT
BLADE SURG 15 STRL SS (BLADE) ×4
BNDG CMPR 9X4 STRL LF SNTH (GAUZE/BANDAGES/DRESSINGS)
BNDG ESMARK 4X9 LF (GAUZE/BANDAGES/DRESSINGS) ×1 IMPLANT
BNDG GAUZE ELAST 4 BULKY (GAUZE/BANDAGES/DRESSINGS) ×2 IMPLANT
CANISTER SUCT 1200ML W/VALVE (MISCELLANEOUS) ×1 IMPLANT
CORDS BIPOLAR (ELECTRODE) ×2 IMPLANT
COVER TABLE BACK 60X90 (DRAPES) ×2 IMPLANT
CUFF TOURNIQUET SINGLE 18IN (TOURNIQUET CUFF) ×2 IMPLANT
DRAPE EXTREMITY T 121X128X90 (DRAPE) ×2 IMPLANT
DRAPE OEC MINIVIEW 54X84 (DRAPES) ×2 IMPLANT
DRSG EMULSION OIL 3X3 NADH (GAUZE/BANDAGES/DRESSINGS) ×1 IMPLANT
GAUZE VASELINE 3X9 (GAUZE/BANDAGES/DRESSINGS) ×1 IMPLANT
GAUZE XEROFORM 1X8 LF (GAUZE/BANDAGES/DRESSINGS) ×2 IMPLANT
GLOVE BIO SURGEON STRL SZ 6 (GLOVE) ×1 IMPLANT
GLOVE BIOGEL PI IND STRL 6.5 (GLOVE) IMPLANT
GLOVE BIOGEL PI IND STRL 8.5 (GLOVE) ×1 IMPLANT
GLOVE BIOGEL PI INDICATOR 6.5 (GLOVE) ×1
GLOVE BIOGEL PI INDICATOR 8.5 (GLOVE) ×1
GLOVE SURG ORTHO 8.0 STRL STRW (GLOVE) ×2 IMPLANT
GOWN STRL REUS W/ TWL LRG LVL3 (GOWN DISPOSABLE) IMPLANT
GOWN STRL REUS W/ TWL XL LVL3 (GOWN DISPOSABLE) ×1 IMPLANT
GOWN STRL REUS W/TWL LRG LVL3 (GOWN DISPOSABLE) ×2
GOWN STRL REUS W/TWL XL LVL3 (GOWN DISPOSABLE) ×2
NDL HYPO 25X1 1.5 SAFETY (NEEDLE) IMPLANT
NEEDLE HYPO 25X1 1.5 SAFETY (NEEDLE) ×2 IMPLANT
PACK BASIN DAY SURGERY FS (CUSTOM PROCEDURE TRAY) ×2 IMPLANT
PAD CAST 3X4 CTTN HI CHSV (CAST SUPPLIES) IMPLANT
PADDING CAST ABS 3INX4YD NS (CAST SUPPLIES) ×1
PADDING CAST ABS COTTON 3X4 (CAST SUPPLIES) IMPLANT
PADDING CAST COTTON 3X4 STRL (CAST SUPPLIES) ×2
PLATE STANDARD DVR LEFT (Plate) ×2 IMPLANT
PLATE STD DVR LT 24X51 (Plate) IMPLANT
SCREW LOCK 12X2.7X 3 LD (Screw) IMPLANT
SCREW LOCK 14X2.7X 3 LD TPR (Screw) IMPLANT
SCREW LOCK 16X2.7X 3 LD TPR (Screw) IMPLANT
SCREW LOCK 18X2.7X 3 LD TPR (Screw) IMPLANT
SCREW LOCK 20X2.7X 3 LD TPR (Screw) IMPLANT
SCREW LOCK 22X2.7X 3 LD TPR (Screw) IMPLANT
SCREW LOCKING 2.7X12MM (Screw) ×6 IMPLANT
SCREW LOCKING 2.7X14 (Screw) ×4 IMPLANT
SCREW LOCKING 2.7X16 (Screw) ×2 IMPLANT
SCREW LOCKING 2.7X18 (Screw) ×2 IMPLANT
SCREW LOCKING 2.7X20MM (Screw) ×10 IMPLANT
SCREW LOCKING 2.7X22MM (Screw) ×2 IMPLANT
SLEEVE SCD COMPRESS KNEE MED (MISCELLANEOUS) ×2 IMPLANT
SPLINT PLASTER CAST XFAST 3X15 (CAST SUPPLIES) IMPLANT
SPLINT PLASTER XTRA FASTSET 3X (CAST SUPPLIES) ×1
SPONGE GAUZE 4X4 12PLY (GAUZE/BANDAGES/DRESSINGS) ×2 IMPLANT
SPONGE GAUZE 4X4 12PLY STER LF (GAUZE/BANDAGES/DRESSINGS) ×1 IMPLANT
STOCKINETTE 4X48 STRL (DRAPES) ×2 IMPLANT
SUCTION FRAZIER TIP 10 FR DISP (SUCTIONS) IMPLANT
SUT PROLENE 4 0 PS 2 18 (SUTURE) ×1 IMPLANT
SUT VIC AB 2-0 SH 27 (SUTURE) ×2
SUT VIC AB 2-0 SH 27X BRD (SUTURE) IMPLANT
SUT VIC AB 4-0 PS2 18 (SUTURE) ×1 IMPLANT
SUT VICRYL RAPIDE 4/0 PS 2 (SUTURE) ×2 IMPLANT
SYR BULB 3OZ (MISCELLANEOUS) ×2 IMPLANT
SYR CONTROL 10ML LL (SYRINGE) IMPLANT
TOWEL OR 17X24 6PK STRL BLUE (TOWEL DISPOSABLE) ×3 IMPLANT
TUBE CONNECTING 12X1/4 (SUCTIONS) IMPLANT
UNDERPAD 30X30 INCONTINENT (UNDERPADS AND DIAPERS) ×2 IMPLANT
WATER STERILE IRR 500ML POUR (IV SOLUTION) ×1 IMPLANT

## 2014-04-29 NOTE — Anesthesia Preprocedure Evaluation (Addendum)
Anesthesia Evaluation  Patient identified by MRN, date of birth, ID band Patient awake    Reviewed: Allergy & Precautions, H&P , NPO status , Patient's Chart, lab work & pertinent test results  Airway Mallampati: II TM Distance: >3 FB Neck ROM: Full    Dental no notable dental hx. (+) Teeth Intact, Dental Advisory Given   Pulmonary neg pulmonary ROS,  *RADIOLOGY REPORT*   Clinical Data:  Chest pain.   CHEST - 2 VIEW 11/12  Findings: The heart size and mediastinal contours are within normal limits.  Both lungs are clear.  No edema, infiltrate or nodule identified.  No evidence of pleural fluid.  The bony thorax shows mild degenerative changes of the spine. breath sounds clear to auscultation  Pulmonary exam normal       Cardiovascular Exercise Tolerance: Poor negative cardio ROS  Rhythm:Regular Rate:Normal     Neuro/Psych Depression Short term memory loss only CVA, Residual Symptoms negative psych ROS   GI/Hepatic negative GI ROS, Neg liver ROS,   Endo/Other  diabetes, Poorly Controlled, Type 2Hypothyroidism   Renal/GU negative Renal ROS  negative genitourinary   Musculoskeletal negative musculoskeletal ROS (+) Arthritis -,   Abdominal   Peds negative pediatric ROS (+)  Hematology negative hematology ROS (+)   Anesthesia Other Findings Discolored teeth  Reproductive/Obstetrics negative OB ROS                      Anesthesia Physical Anesthesia Plan  ASA: II  Anesthesia Plan: General   Post-op Pain Management:    Induction: Intravenous  Airway Management Planned: LMA  Additional Equipment:   Intra-op Plan:   Post-operative Plan: Extubation in OR  Informed Consent: I have reviewed the patients History and Physical, chart, labs and discussed the procedure including the risks, benefits and alternatives for the proposed anesthesia with the patient or authorized representative who  has indicated his/her understanding and acceptance.   Dental advisory given  Plan Discussed with: CRNA and Anesthesiologist  Anesthesia Plan Comments:        Anesthesia Quick Evaluation

## 2014-04-29 NOTE — Anesthesia Procedure Notes (Signed)
Procedure Name: LMA Insertion Date/Time: 04/29/2014 12:41 PM Performed by: Tyrone NineSAUVE, Jashanti Clinkscale F Pre-anesthesia Checklist: Patient identified, Timeout performed, Emergency Drugs available, Suction available and Patient being monitored Patient Re-evaluated:Patient Re-evaluated prior to inductionOxygen Delivery Method: Circle system utilized Preoxygenation: Pre-oxygenation with 100% oxygen Intubation Type: IV induction Ventilation: Mask ventilation without difficulty LMA: LMA inserted LMA Size: 4.0 Number of attempts: 1 Airway Equipment and Method: Bite block Placement Confirmation: positive ETCO2 Tube secured with: Tape Dental Injury: Teeth and Oropharynx as per pre-operative assessment

## 2014-04-29 NOTE — Brief Op Note (Signed)
04/29/2014  9:43 AM  PATIENT:  Brittney FreesPhyllis F Moss  68 y.o. female  PRE-OPERATIVE DIAGNOSIS:  left wrist intra articular dista RADIUS FRACTURE  POST-OPERATIVE DIAGNOSIS:  * No post-op diagnosis entered *  PROCEDURE:  Procedure(s) with comments: OPEN REDUCTION INTERNAL FIXATION (ORIF) DISTAL RADIAL FRACTURE (Left) - ANES GENERAL WITH AXILLARY BLOCK    MINI C ARM  DEPUY CROSS LOCK PLATE      SURGEON:  Surgeon(s) and Role:    * Sharma CovertFred W Atticus Wedin, MD - Primary  PHYSICIAN ASSISTANT:   ASSISTANTS: none   ANESTHESIA:   general  EBL:     BLOOD ADMINISTERED:none  DRAINS: none   LOCAL MEDICATIONS USED:  MARCAINE     SPECIMEN:  No Specimen  DISPOSITION OF SPECIMEN:  N/A  COUNTS:  YES  TOURNIQUET:  * No tourniquets in log *  DICTATION: .Other Dictation: Dictation Number [id number]  PLAN OF CARE: Admit for overnight observation  PATIENT DISPOSITION:  PACU - hemodynamically stable.   Delay start of Pharmacological VTE agent (>24hrs) due to surgical blood loss or risk of bleeding: not applicable

## 2014-04-29 NOTE — Discharge Instructions (Signed)
KEEP BANDAGE CLEAN AND DRY CALL OFFICE FOR F/U APPT [phone] in 2 weeks [facility] CE[facility] PHONE [phone] KEEP HAND ELEVATED ABOVE HEART OK TO APPLY ICE TO OPERATIVE AREA CONTACT OFFICE IF ANY WORSENING PAIN OR CONCERNS.

## 2014-04-29 NOTE — Transfer of Care (Signed)
Immediate Anesthesia Transfer of Care Note  Patient: Brittney Moss  Procedure(s) Performed: Procedure(s): OPEN REDUCTION INTERNAL FIXATION (ORIF) DISTAL RADIAL FRACTURE (Left)  Patient Location: PACU  Anesthesia Type:General  Level of Consciousness: awake, alert , oriented and patient cooperative  Airway & Oxygen Therapy: Patient Spontanous Breathing and Patient connected to nasal cannula oxygen  Post-op Assessment: Report given to PACU RN and Post -op Vital signs reviewed and stable  Post vital signs: Reviewed and stable  Complications: No apparent anesthesia complications

## 2014-04-29 NOTE — H&P (Signed)
Brittney Moss is an 68 y.o. female.   Chief Complaint: Left wrist injury HPI: Pt fell last week Pt with closed injury to left wrist Pt with displaced fracture Pt here for surgery on left wrist No prior surgery to left wrist  Past Medical History  Diagnosis Date  . Hypothyroidism   . Depression   . Hyperlipidemia   . 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 Gait disorder   . Short-term memory loss     post  cva  in 05/ 2000  . Type 2 diabetes mellitus   . Radius fracture     left wrist    Past Surgical History  Procedure Laterality Date  . Tubal ligation    . Wisdom tooth extraction    . Tonsillectomy    . Subdrual hematoma  2000    as a result of the stroke   . Cholecystectomy  11/09/2011    Procedure: LAPAROSCOPIC CHOLECYSTECTOMY WITH INTRAOPERATIVE CHOLANGIOGRAM;  Surgeon: Velora Hecklerodd M Gerkin, MD;  Location: [facility];  Service: General;  Laterality: N/A;  . Benign breast bx    . Subdural hematoma evacuation via craniotomy  05/  2000    No family history on file. Social History:  reports that she has never smoked. She has never used smokeless tobacco. She reports that she does not drink alcohol or use illicit drugs.  Allergies: No Known Allergies  No prescriptions prior to admission    No results found for this or any previous visit (from the past 48 hour(s)). No results found.  ROS NO RECENT ILLNESSES OR HOSPITALIZATIONS  There were no vitals taken for this visit. Physical Exam  General Appearance:  Alert, cooperative, no distress, appears stated age  Head:  Normocephalic, without obvious abnormality, atraumatic  Eyes:  Pupils equal, conjunctiva/corneas clear,         Throat: Lips, mucosa, and tongue normal; teeth and gums normal  Neck: No visible masses     Lungs:   respirations unlabored  Chest Wall:  No tenderness or deformity   Heart:  Regular rate and rhythm,  Abdomen:   Soft, non-tender,         Extremities: LEFT WRIST: IN OFFICE SKIN INTACT,FINGERS WARM WELL PERFUSED. WIGGLES FINGERS, FINGERS WARM WELL PERFUSED SPLINT IN PLACE  Pulses: 2+ and symmetric  Skin: Skin color, texture, turgor normal, no rashes or lesions     Neurologic: Normal    Assessment/Plan LEFT WRIST COMMINUTED INTRA-ARTICULAR DISTAL RADIUS FRACTURE OF 3 OR MORE FRAGMENTS  LEFT WRIST OPEN REDUCTION AND INTERNAL FIXATION AND REPAIR AS INDICATED  R/B/A DISCUSSED WITH PT IN OFFICE.  PT VOICED UNDERSTANDING OF PLAN CONSENT SIGNED DAY OF SURGERY PT SEEN AND EXAMINED PRIOR TO OPERATIVE PROCEDURE/DAY OF SURGERY SITE MARKED. QUESTIONS ANSWERED WILL REMAIN OVERNIGHT OBSERVATION FOLLOWING SURGERY  Sharma CovertORTMANN,FRED W 04/29/2014, 1226 PM

## 2014-04-30 ENCOUNTER — Encounter (HOSPITAL_BASED_OUTPATIENT_CLINIC_OR_DEPARTMENT_OTHER): Payer: Self-pay | Admitting: Orthopedic Surgery

## 2014-04-30 NOTE — Anesthesia Postprocedure Evaluation (Signed)
  Anesthesia Post-op Note  Patient: Brittney Moss  Procedure(s) Performed: Procedure(s) (LRB): OPEN REDUCTION INTERNAL FIXATION (ORIF) DISTAL RADIAL FRACTURE (Left)  Patient Location: PACU  Anesthesia Type: General  Level of Consciousness: awake and alert   Airway and Oxygen Therapy: Patient Spontanous Breathing  Post-op Pain: mild  Post-op Assessment: Post-op Vital signs reviewed, Patient's Cardiovascular Status Stable, Respiratory Function Stable, Patent Airway and No signs of Nausea or vomiting  Last Vitals:  Filed Vitals:   04/30/14 1035  BP: 127/76  Pulse: 86  Temp: 36.6 C  Resp: 16    Post-op Vital Signs: stable   Complications: No apparent anesthesia complications

## 2014-04-30 NOTE — Op Note (Signed)
NAME:  Brittney Moss, Brittney Moss              ACCOUNT NO.:  [id number]  MEDICAL RECORD NO.:  [id number]  LOCATION:  1310                         [facility]  PHYSICIAN:  Madelynn DoneFred W Ortmann IV, MD  DATE OF BIRTH:  11-07-1945  DATE OF PROCEDURE:  04/29/2014 DATE OF DISCHARGE:                              OPERATIVE REPORT   PREOPERATIVE DIAGNOSIS:  Left wrist intra-articular distal radius fracture, 3 or more fragments.  POSTOPERATIVE DIAGNOSIS:  Left wrist intra-articular distal radius fracture, 3 or more fragments.  ATTENDING PHYSICIAN:  Madelynn DoneFred W Ortmann IV, MD who was scrubbed and present for the entire procedure.  ASSISTANT SURGEON:  None.  ANESTHESIA:  General via LMA.  SURGICAL TREATMENT: 1. Open treatment of left wrist intra-articular distal radius fracture     in 3 or more fragments. 2. Left wrist brachioradialis tendon release and lengthening. 3. Left radiographs 3 views, left wrist.  SURGICAL IMPLANTS:  Biomet DVR Crosslock plate, standard plate, 7 distal locking pegs, 5 screws proximally, locking and nonlocking screws.  RADIOGRAPHIC INTERPRETATION:  Three views of the wrist were submitted and they do show the volar plate fixation in place.  There was good restoration of alignment in all planes in the radial height inclination and tilt.  SURGICAL INDICATIONS:  Ms. Brittney Moss is a right-hand-dominant female who sustained a high comminuted intra-articular distal radius fracture.  The patient was seen and evaluated in the office and was recommended that she undergo the above procedure.  Risks, benefits, and alternatives were discussed in detail with the patient and signed informed consent was obtained.  Risks include, but not limited to bleeding, infection, damage to nearby nerves, arteries, or tendons, loss of motion to the wrist and digits, incomplete relief of symptoms, and need for further surgical intervention.  DESCRIPTION OF PROCEDURE:  The patient was properly identified in  the preoperative holding area and a mark with a permanent marker made on the right wrist to indicate correct operative site.  Left wrist was identified as correct operative site.  The patient was brought back to the operating room, placed supine on the anesthesia room table.  General anesthesia was administered.  The patient tolerated this well.  A well- padded tourniquet was placed on the left brachium and sealed with 1000 drape.  Left upper extremity was then prepped and draped in normal sterile fashion.  Time-out was called, correct side was identified, and procedure was then begun.  Attention was then turned to the left wrist where a large skin incision was made directly over the FCR sheath. Dissection was carried down through the skin and subcutaneous tissue. The FCR sheath was then opened proximally and distally.  Going through the floor of the FCR sheath, the FPL was carefully retracted.  An L- shaped pronator quadratus flap was then carried out.  The brachioradialis was then carefully identified and released in length and off the radial styloid in order to reduce the radial columns and more importantly to reduce the intra-articular fracture along the radial column.  Following this, the ulnar column was then reduced and an open reduction was then performed with the 2 fracture lines extending along the central and ulnar column.  This was an  intra-articular fracture in 3 or more fragments.  After open reduction was achieved, the volar plate was then applied, after this we placed the oblong screw hole proximally. The two 7 screws were then used.  Following this, it was held distally with a K-wire.  Once this was confirmed using mini C-arm, distal fixation was then carried out from the most proximal row going from an ulnar to radial direction and then the distal row.  These were confirmed using a mini C-arm and awl in varying planes.  Following this, attention was then turned  proximally.  Remaining locking and nonlocking screws were then placed proximally.  The wound was then thoroughly irrigated. Copious wound irrigation was done throughout.  After internal fixation, attention was then turned to the closure.  Pronator quadratus was then closed with 2-0 Vicryl suture and subcutaneous tissue was closed with 4- 0 Vicryl, skin closed with 4-0 Vicryl Rapide.  Adaptic dressing and sterile compressive bandage applied.  The patient was then placed in a well-padded sugar-tong splint, extubated, and taken to the recovery room in good condition.  POSTPROCEDURAL PLAN:  The patient admitted overnight for IV antibiotics and pain control, discharged in the morning.  Seen back in our office in approximately 2 weeks for wound check.  X-rays out of splint, application of short-arm cast for a total of 4 weeks, therapy at the 4- week mark.  Radiographs at each visit.     Madelynn DoneFred W Ortmann IV, MD     FWO/MEDQ  D:  04/29/2014  T:  04/29/2014  Job:  [id number]

## 2014-04-30 NOTE — Discharge Summary (Signed)
Physician Discharge Summary  Patient ID: Brittney Moss MRN: [id number] DOB/AGE: Nov 18, 1945 68 y.o.  Admit date: 04/29/2014 Discharge date: 04/30/2014  Admission Diagnoses: left wrist intra articular dista RADIUS FRACTURE Past Medical History  Diagnosis Date  . Hypothyroidism   . Depression   . Hyperlipidemia   . 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 Gait disorder   . Short-term memory loss     post  cva  in 05/ 2000  . Type 2 diabetes mellitus   . Radius fracture     left wrist    Discharge Diagnoses:  Active Problems:   Fracture of left distal radius   Surgeries: Procedure(s): OPEN REDUCTION INTERNAL FIXATION (ORIF) DISTAL RADIAL FRACTURE on 04/29/2014    Consultants:  NONE  Discharged Condition: Improved  Hospital Course: Brittney Moss is an 68 y.o. female who was admitted 04/29/2014 with a chief complaint of No chief complaint on file. , and found to have a diagnosis of left wrist intra articular dista RADIUS FRACTURE.  They were brought to the operating room on 04/29/2014 and underwent Procedure(s): OPEN REDUCTION INTERNAL FIXATION (ORIF) DISTAL RADIAL FRACTURE.    They were given perioperative antibiotics: Anti-infectives   Start     Dose/Rate Route Frequency Ordered Stop   04/29/14 2030  ceFAZolin (ANCEF) IVPB 1 g/50 mL premix  Status:  Discontinued     1 g 100 mL/hr over 30 Minutes Intravenous 3 times per day 04/29/14 1427 04/29/14 1643   04/29/14 2000  ceFAZolin (ANCEF) IVPB 1 g/50 mL premix     1 g 100 mL/hr over 30 Minutes Intravenous Every 8 hours 04/29/14 1643 04/30/14 1959   04/29/14 1430  ceFAZolin (ANCEF) IVPB 1 g/50 mL premix  Status:  Discontinued     1 g 100 mL/hr over 30 Minutes Intravenous NOW 04/29/14 1427 04/29/14 1636   04/29/14 1015  ceFAZolin (ANCEF) IVPB 2 g/50 mL premix     2 g 100 mL/hr over 30  Minutes Intravenous On call to O.R. 04/29/14 1011 04/29/14 1247    .  They were given sequential compression devices, early ambulation, and Other (comment) AMBULATION for DVT prophylaxis.  Recent vital signs: Patient Vitals for the past 24 hrs:  BP Temp Temp src Pulse Resp SpO2 Height Weight  04/30/14 1035 127/76 mmHg 97.8 F (36.6 C) - 86 16 96 % - -  04/30/14 0530 121/73 mmHg 97.7 F (36.5 C) Oral 76 16 93 % - -  04/30/14 0155 116/71 mmHg 97.7 F (36.5 C) Oral 84 16 93 % - -  04/29/14 2112 131/77 mmHg 99 F (37.2 C) Oral 97 16 92 % - -  04/29/14 1915 147/68 mmHg 99 F (37.2 C) Oral 95 16 90 % - -  04/29/14 1815 155/82 mmHg 98.7 F (37.1 C) Oral 97 16 95 % - -  04/29/14 1715 177/98 mmHg 98.6 F (37 C) Oral 97 16 95 % 5' (1.524 m) 88.4 kg (194 lb 14.2 oz)  04/29/14 1615 180/95 mmHg 97.6 F (36.4 C) - 94 16 97 % - -  04/29/14 1545 179/96 mmHg - - 90 13 97 % - -  04/29/14 1530 185/95 mmHg - - 89 12 97 % - -  04/29/14 1515 193/95 mmHg - - 89 13 98 % - -  04/29/14 1500 190/96 mmHg - - 87 13 98 % - -  04/29/14 1445 188/96 mmHg - - 85 16 100 % - -  04/29/14 1430 171/93 mmHg - - 85 13 100 % - -  04/29/14 1415 179/97 mmHg 97.4 F (36.3 C) - 88 13 100 % - -  .  Recent laboratory studies: No results found.  Discharge Medications:     Medication List    TAKE these medications       acetaminophen 500 MG tablet  Commonly known as:  TYLENOL  Take 500 mg by mouth every 6 (six) hours as needed for pain.     buPROPion 150 MG 12 hr tablet  Commonly known as:  WELLBUTRIN SR  Take 150 mg by mouth 2 (two) times daily.     CERTA-VITE SENIOR-LUTEIN PO  Take 1 tablet by mouth daily.     Cholecalciferol 1000 UNITS tablet  Take 1,000 Units by mouth daily.     clopidogrel 75 MG tablet  Commonly known as:  PLAVIX  Take 75 mg by mouth daily after breakfast.     CRESTOR 20 MG tablet  Generic drug:  rosuvastatin  Take 20 mg by mouth daily.     divalproex 125 MG DR tablet  Commonly  known as:  DEPAKOTE  Take 125 mg by mouth 2 (two) times daily.     docusate sodium 100 MG capsule  Commonly known as:  COLACE  Take 1 capsule (100 mg total) by mouth 2 (two) times daily.     enalapril 5 MG tablet  Commonly known as:  VASOTEC  Take 10 mg by mouth daily.     levothyroxine 125 MCG tablet  Commonly known as:  SYNTHROID, LEVOTHROID  Take 125 mcg by mouth daily at 6 (six) AM.     LORazepam 0.5 MG tablet  Commonly known as:  ATIVAN  Take 1 mg by mouth at bedtime.     mineral oil external liquid  Place 2 drops into both ears once a week. On Wednesday     oxyCODONE-acetaminophen 5-325 MG per tablet  Commonly known as:  ROXICET  Take 1 tablet by mouth every 4 (four) hours as needed for severe pain.     sertraline 50 MG tablet  Commonly known as:  ZOLOFT  Take 75 mg by mouth every evening.     vitamin C 500 MG tablet  Commonly known as:  ASCORBIC ACID  Take 1 tablet (500 mg total) by mouth daily.      ASK your doctor about these medications       HYDROcodone-acetaminophen 5-325 MG per tablet  Commonly known as:  NORCO/VICODIN  Take 1-2 tablets by mouth every 6 (six) hours as needed for moderate pain.        Diagnostic Studies: No results found.  They benefited maximally from their hospital stay and there were no complications.     Disposition: 01-Home or Self Care     Discharge Instructions   Discharge patient    Complete by:  As directed   Discharge to home IN AM 6/25     Discharge patient    Complete by:  As directed   Discharge to home          Follow-up Information   Follow up with Sharma CovertTMANN,Mekiyah Gladwell W, MD. Schedule an appointment as soon as possible for a visit in 2 weeks.   Specialty:  Orthopedic Surgery   Contact information:   [address] [place] [address] [phone]        Signed: Sharma CovertORTMANN,Jaionna Weisse W  04/30/2014, 1:05 PM

## 2014-04-30 NOTE — Progress Notes (Signed)
Ortho Tech John called in to check ace splint left arm.Fingers edematous but warm, brisk cap refill,cms+.Cont to enc frequent active rom of fingers.left arm up above heart,Ortho tech adjusted and padded ace splint edges,and re-wrapped ace splint,loosening area around left hand.Pt stated left fingers felt much better afterwards.Linward HeadlandBeverly, Brittney Moss

## 2014-05-05 NOTE — Telephone Encounter (Signed)
Closing encounter

## 2014-05-07 ENCOUNTER — Emergency Department (HOSPITAL_COMMUNITY)
Admission: EM | Admit: 2014-05-07 | Discharge: 2014-05-07 | Disposition: A | Discharge status: Home / Self Care | Payer: Medicare Other | Attending: Emergency Medicine | Admitting: Emergency Medicine

## 2014-05-07 ENCOUNTER — Encounter (HOSPITAL_COMMUNITY): Payer: Self-pay | Admitting: Emergency Medicine

## 2014-05-07 DIAGNOSIS — IMO0002 Reserved for concepts with insufficient information to code with codable children: Secondary | ICD-10-CM | POA: Insufficient documentation

## 2014-05-07 DIAGNOSIS — E785 Hyperlipidemia, unspecified: Secondary | ICD-10-CM | POA: Insufficient documentation

## 2014-05-07 DIAGNOSIS — S52502A Unspecified fracture of the lower end of left radius, initial encounter for closed fracture: Secondary | ICD-10-CM

## 2014-05-07 DIAGNOSIS — Z7902 Long term (current) use of antithrombotics/antiplatelets: Secondary | ICD-10-CM | POA: Insufficient documentation

## 2014-05-07 DIAGNOSIS — F329 Major depressive disorder, single episode, unspecified: Secondary | ICD-10-CM | POA: Insufficient documentation

## 2014-05-07 DIAGNOSIS — F3289 Other specified depressive episodes: Secondary | ICD-10-CM | POA: Insufficient documentation

## 2014-05-07 DIAGNOSIS — R413 Other amnesia: Secondary | ICD-10-CM | POA: Insufficient documentation

## 2014-05-07 DIAGNOSIS — Z79899 Other long term (current) drug therapy: Secondary | ICD-10-CM | POA: Insufficient documentation

## 2014-05-07 DIAGNOSIS — Z8673 Personal history of transient ischemic attack (TIA), and cerebral infarction without residual deficits: Secondary | ICD-10-CM | POA: Insufficient documentation

## 2014-05-07 DIAGNOSIS — R269 Unspecified abnormalities of gait and mobility: Secondary | ICD-10-CM | POA: Insufficient documentation

## 2014-05-07 DIAGNOSIS — E039 Hypothyroidism, unspecified: Secondary | ICD-10-CM | POA: Insufficient documentation

## 2014-05-07 DIAGNOSIS — Z9889 Other specified postprocedural states: Secondary | ICD-10-CM | POA: Insufficient documentation

## 2014-05-07 DIAGNOSIS — E119 Type 2 diabetes mellitus without complications: Secondary | ICD-10-CM | POA: Insufficient documentation

## 2014-05-07 NOTE — ED Notes (Signed)
MD at bedside. 

## 2014-05-07 NOTE — ED Notes (Signed)
Pt was seen last week and had soft cast applied to left arm for fracture. Today pt took off her cast.  Family members called doctor's office was instructed to come to ED for evaluation of cast bc it being so late in the day.

## 2014-05-07 NOTE — Discharge Instructions (Signed)

## 2014-05-07 NOTE — ED Provider Notes (Signed)
CSN: [id number]     Arrival date & time 05/07/14  1739 History   First MD Initiated Contact with Patient 05/07/14 1753     Chief Complaint  Patient presents with  . re-apply cast to left arm     (Consider location/radiation/quality/duration/timing/severity/associated sxs/prior Treatment) The history is provided by the patient and a relative.   patient has had recent ORIF of left distal radius fracture. She has a sugar tong splint in place. Patient had removed the splint because of it. No trauma. No other complaints. She was brought in to have the splint replaced  Past Medical History  Diagnosis Date  . Hypothyroidism   . Depression   . Hyperlipidemia   . 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 Gait disorder   . Short-term memory loss     post  cva  in 05/ 2000  . Type 2 diabetes mellitus   . Radius fracture     left wrist   Past Surgical History  Procedure Laterality Date  . Tubal ligation    . Wisdom tooth extraction    . Tonsillectomy    . Subdrual hematoma  2000    as a result of the stroke   . Cholecystectomy  11/09/2011    Procedure: LAPAROSCOPIC CHOLECYSTECTOMY WITH INTRAOPERATIVE CHOLANGIOGRAM;  Surgeon: Velora Hecklerodd M Gerkin, MD;  Location: [facility];  Service: General;  Laterality: N/A;  . Benign breast bx    . Subdural hematoma evacuation via craniotomy  05/  2000  . Open reduction internal fixation (orif) distal radial fracture Left 04/29/2014    Procedure: OPEN REDUCTION INTERNAL FIXATION (ORIF) DISTAL RADIAL FRACTURE;  Surgeon: Sharma CovertFred W Ortmann, MD;  [facility];  Service: Orthopedics;  Laterality: Left;   No family history on file. History  Substance Use Topics  . Smoking status: Never Smoker   . Smokeless tobacco: Never Used  . Alcohol Use: No   OB History   Grav Para Term Preterm Abortions TAB SAB Ect Mult  Living                 Review of Systems  Constitutional: Negative for fever.  Skin: Positive for wound.  Neurological: Negative for weakness and numbness.      Allergies  Review of patient's allergies indicates no known allergies.  Home Medications   Prior to Admission medications   Medication Sig Start Date End Date Taking? Authorizing Provider  acetaminophen (TYLENOL) 500 MG tablet Take 500 mg by mouth every 6 (six) hours as needed for pain.    Historical Provider, MD  buPROPion (WELLBUTRIN SR) 150 MG 12 hr tablet Take 150 mg by mouth 2 (two) times daily.     Historical Provider, MD  Cholecalciferol 1000 UNITS tablet Take 1,000 Units by mouth daily.     Historical Provider, MD  clopidogrel (PLAVIX) 75 MG tablet Take 75 mg by mouth daily after breakfast.     Historical Provider, MD  CRESTOR 20 MG tablet Take 20 mg by mouth daily.  01/05/14   Historical Provider, MD  divalproex (DEPAKOTE) 125 MG DR tablet Take 125 mg by mouth 2 (two) times daily.    Historical Provider, MD  docusate sodium (COLACE) 100 MG capsule Take 1 capsule (100 mg total) by mouth 2 (two) times daily. 04/29/14   Sharma CovertFred W Ortmann, MD  enalapril (VASOTEC)  5 MG tablet Take 10 mg by mouth daily.  01/06/14   Historical Provider, MD  HYDROcodone-acetaminophen (NORCO/VICODIN) 5-325 MG per tablet Take 1-2 tablets by mouth every 6 (six) hours as needed for moderate pain.    Historical Provider, MD  levothyroxine (SYNTHROID, LEVOTHROID) 125 MCG tablet Take 125 mcg by mouth daily at 6 (six) AM.     Historical Provider, MD  LORazepam (ATIVAN) 0.5 MG tablet Take 1 mg by mouth at bedtime.     Historical Provider, MD  mineral oil external liquid Place 2 drops into both ears once a week. On Wednesday    Historical Provider, MD  Multiple Vitamins-Minerals (CERTA-VITE SENIOR-LUTEIN PO) Take 1 tablet by mouth daily.    Historical Provider, MD  oxyCODONE-acetaminophen (ROXICET) 5-325 MG per tablet Take 1 tablet by mouth every 4 (four)  hours as needed for severe pain. 04/29/14   Sharma CovertFred W Ortmann, MD  sertraline (ZOLOFT) 50 MG tablet Take 75 mg by mouth every evening.     Historical Provider, MD  vitamin C (ASCORBIC ACID) 500 MG tablet Take 1 tablet (500 mg total) by mouth daily. 04/29/14   Sharma CovertFred W Ortmann, MD   BP 142/76  Pulse 87  Temp(Src) 99.5 F (37.5 C) (Oral)  Resp 18  SpO2 100% Physical Exam  Constitutional: She appears well-developed and well-nourished.  Musculoskeletal:  Splint partially removed to left forearm. The volar aspect of the wrist has staples in place wound is clean dry and intact. Neurovascular intact over hand.  Neurological: She is alert.  Skin: Skin is warm.    ED Course  Procedures (including critical care time) Labs Review Labs Reviewed - No data to display  Imaging Review No results found.   EKG Interpretation None      MDM   Final diagnoses:  Distal radius fracture, left, closed, initial encounter    Splint replaced by orthopedic tech. Patient has Hand follow up.    Juliet RudeNathan R. Rubin PayorPickering, MD 05/07/14 [phone]

## 2014-07-31 ENCOUNTER — Ambulatory Visit: Payer: Medicare Other | Admitting: Neurology

## 2014-11-13 ENCOUNTER — Other Ambulatory Visit: Payer: Self-pay

## 2014-11-13 DIAGNOSIS — Z1231 Encounter for screening mammogram for malignant neoplasm of breast: Secondary | ICD-10-CM

## 2014-11-13 DIAGNOSIS — Z139 Encounter for screening, unspecified: Secondary | ICD-10-CM

## 2014-11-20 ENCOUNTER — Ambulatory Visit: Payer: Medicare Other

## 2014-12-02 ENCOUNTER — Ambulatory Visit: Payer: Medicare Other

## 2014-12-04 ENCOUNTER — Ambulatory Visit
Admission: RE | Admit: 2014-12-04 | Discharge: 2014-12-04 | Disposition: A | Discharge status: Home / Self Care | Payer: Medicare Other | Source: Ambulatory Visit

## 2014-12-04 DIAGNOSIS — Z1231 Encounter for screening mammogram for malignant neoplasm of breast: Secondary | ICD-10-CM

## 2015-08-26 ENCOUNTER — Encounter (HOSPITAL_COMMUNITY): Payer: Self-pay | Admitting: *Deleted

## 2015-08-26 ENCOUNTER — Emergency Department (HOSPITAL_COMMUNITY)
Admission: EM | Admit: 2015-08-26 | Discharge: 2015-08-26 | Disposition: A | Discharge status: Home / Self Care | Payer: Medicare Other | Attending: Emergency Medicine | Admitting: Emergency Medicine

## 2015-08-26 DIAGNOSIS — Z8781 Personal history of (healed) traumatic fracture: Secondary | ICD-10-CM | POA: Diagnosis not present

## 2015-08-26 DIAGNOSIS — S0093XA Contusion of unspecified part of head, initial encounter: Secondary | ICD-10-CM | POA: Diagnosis not present

## 2015-08-26 DIAGNOSIS — Y998 Other external cause status: Secondary | ICD-10-CM | POA: Insufficient documentation

## 2015-08-26 DIAGNOSIS — W1812XA Fall from or off toilet with subsequent striking against object, initial encounter: Secondary | ICD-10-CM | POA: Insufficient documentation

## 2015-08-26 DIAGNOSIS — E039 Hypothyroidism, unspecified: Secondary | ICD-10-CM | POA: Insufficient documentation

## 2015-08-26 DIAGNOSIS — E785 Hyperlipidemia, unspecified: Secondary | ICD-10-CM | POA: Insufficient documentation

## 2015-08-26 DIAGNOSIS — Y92121 Bathroom in nursing home as the place of occurrence of the external cause: Secondary | ICD-10-CM | POA: Diagnosis not present

## 2015-08-26 DIAGNOSIS — F329 Major depressive disorder, single episode, unspecified: Secondary | ICD-10-CM | POA: Insufficient documentation

## 2015-08-26 DIAGNOSIS — Z8679 Personal history of other diseases of the circulatory system: Secondary | ICD-10-CM | POA: Insufficient documentation

## 2015-08-26 DIAGNOSIS — W19XXXA Unspecified fall, initial encounter: Secondary | ICD-10-CM

## 2015-08-26 DIAGNOSIS — S0990XA Unspecified injury of head, initial encounter: Secondary | ICD-10-CM | POA: Diagnosis present

## 2015-08-26 DIAGNOSIS — Z79899 Other long term (current) drug therapy: Secondary | ICD-10-CM | POA: Diagnosis not present

## 2015-08-26 DIAGNOSIS — Z8673 Personal history of transient ischemic attack (TIA), and cerebral infarction without residual deficits: Secondary | ICD-10-CM | POA: Insufficient documentation

## 2015-08-26 DIAGNOSIS — Y92129 Unspecified place in nursing home as the place of occurrence of the external cause: Secondary | ICD-10-CM

## 2015-08-26 DIAGNOSIS — Y9389 Activity, other specified: Secondary | ICD-10-CM | POA: Diagnosis not present

## 2015-08-26 DIAGNOSIS — E119 Type 2 diabetes mellitus without complications: Secondary | ICD-10-CM | POA: Diagnosis not present

## 2015-08-26 HISTORY — DX: Occlusion and stenosis of unspecified cerebral artery: I66.9

## 2015-08-26 NOTE — ED Notes (Signed)
Pt states that she lives in the assisted living facility and tonight she rolled out of bed and struck head on the table; pt states that she then went to the bathroom and fell off the commode and struck left knee and the right side of her head; pt denies LOC; pt denies pain; pt ambulatory; pt with hx of CVA and has short term memory problems; Assisted Living Facility insisted that pt come for evaluation

## 2015-08-26 NOTE — ED Notes (Signed)
Patient states she rolled out of bed hitting one side of her head. Patient states she then went to the restroom and fell off of the toilet striking the other side of her head. Patient denies LOC. Denies pain. Patient states she struck her left knee when she fell off the toilet, states her knee is red. Patient denies pain, is ambulatory without assistance. Patient states she came only because her facility insisted.

## 2015-08-26 NOTE — Discharge Instructions (Signed)
Have someone wake you up every 2 hrs for the next 24 hrs and return to the ED for any problems listed on the head injury sheet. Recheck if you get a headache.    Head Injury, Adult You have a head injury. Headaches and throwing up (vomiting) are common after a head injury. It should be easy to wake up from sleeping. Sometimes you must stay in the hospital. Most problems happen within the first 24 hours. Side effects may occur up to 7-10 days after the injury.  WHAT ARE THE TYPES OF HEAD INJURIES? Head injuries can be as minor as a bump. Some head injuries can be more severe. More severe head injuries include:  A jarring injury to the brain (concussion).  A bruise of the brain (contusion). This mean there is bleeding in the brain that can cause swelling.  A cracked skull (skull fracture).  Bleeding in the brain that collects, clots, and forms a bump (hematoma). WHEN SHOULD I GET HELP RIGHT AWAY?   You are confused or sleepy.  You cannot be woken up.  You feel sick to your stomach (nauseous) or keep throwing up (vomiting).  Your dizziness or unsteadiness is getting worse.  You have very bad, lasting headaches that are not helped by medicine. Take medicines only as told by your doctor.  You cannot use your arms or legs like normal.  You cannot walk.  You notice changes in the black spots in the center of the colored part of your eye (pupil).  You have clear or bloody fluid coming from your nose or ears.  You have trouble seeing. During the next 24 hours after the injury, you must stay with someone who can watch you. This person should get help right away (call 911 in the [place] if you start to shake and are not able to control it (have seizures), you pass out, or you are unable to wake up. HOW CAN I PREVENT A HEAD INJURY IN THE FUTURE?  Wear seat belts.  Wear a helmet while bike riding and playing sports like football.  Stay away from dangerous activities around the  house. WHEN CAN I RETURN TO NORMAL ACTIVITIES AND ATHLETICS? See your doctor before doing these activities. You should not do normal activities or play contact sports until 1 week after the following symptoms have stopped:  Headache that does not go away.  Dizziness.  Poor attention.  Confusion.  Memory problems.  Sickness to your stomach or throwing up.  Tiredness.  Fussiness.  Bothered by bright lights or loud noises.  Anxiousness or depression.  Restless sleep. MAKE SURE YOU:   Understand these instructions.  Will watch your condition.  Will get help right away if you are not doing well or get worse.   This information is not intended to replace advice given to you by your health care provider. Make sure you discuss any questions you have with your health care provider.   Document Released: 10/05/2008 Document Revised: 11/13/2014 Document Reviewed: 06/30/2013 Elsevier Interactive Patient Education [employer]

## 2015-08-26 NOTE — ED Notes (Signed)
MD at bedside. 

## 2015-08-26 NOTE — ED Provider Notes (Signed)
CSN: [id number]     Arrival date & time 08/26/15  0244 History  By signing my name below, I, Budd Palmer, attest that this documentation has been prepared under the direction and in the presence of Devoria Albe, MD at 23:50. Electronically Signed: Budd Palmer, ED Scribe. 08/26/2015. 4:02 AM.    Chief Complaint  Patient presents with  . Fall   The history is provided by the patient and a relative. No language interpreter was used.   HPI Comments: Brittney Moss is a 69 y.o. female who presents to the Emergency Department complaining of a fall that occurred about 3 hours ago. Pt states she fell out of the bed and hit the right side of her head. She is not sure if she fell in her sleep or was getting out of bed and fell.  After that she went to the restroom and fell off of the toilet because she fell asleep while sitting on the toilet, hitting the left side of her head on the tile floor. She noted associated "bumps" on each side of her head, which she states have since resolved. Per sister, pt takes an ativan before bed and may have been a little drowsy, causing her to fall. Pt states she is on Plavix. Per sister, who is a retired Engineer, civil (consulting), pt lives in assisted living due to a PMHx of stroke and resultant short term memory loss. She denies smoking. Pt denies LOC, n/v, dizziness, HA, change in vision or neck pain.  PCP Dr Redmond School  Past Medical History  Diagnosis Date  . Hypothyroidism   . Depression   . Hyperlipidemia   . 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 Gait disorder   . Short-term memory loss     post  cva  in 05/ 2000  . Type 2 diabetes mellitus (HCC)   . Radius fracture     left wrist  . Stroke (HCC)   . Blood clots in brain    Past Surgical History  Procedure Laterality Date  . Tubal ligation    . Wisdom tooth extraction    . Tonsillectomy     . Subdrual hematoma  2000    as a result of the stroke   . Cholecystectomy  11/09/2011    Procedure: LAPAROSCOPIC CHOLECYSTECTOMY WITH INTRAOPERATIVE CHOLANGIOGRAM;  Surgeon: Velora Heckler, MD;  Location: [facility];  Service: General;  Laterality: N/A;  . Benign breast bx    . Subdural hematoma evacuation via craniotomy  05/  2000  . Open reduction internal fixation (orif) distal radial fracture Left 04/29/2014    Procedure: OPEN REDUCTION INTERNAL FIXATION (ORIF) DISTAL RADIAL FRACTURE;  Surgeon: Sharma Covert, MD;  Location: [facility];  Service: Orthopedics;  Laterality: Left;   No family history on file. Social History  Substance Use Topics  . Smoking status: Never Smoker   . Smokeless tobacco: Never Used  . Alcohol Use: No   Widowed Lives in ALF  [place] History    No data available     Review of Systems  Gastrointestinal: Negative for nausea and vomiting.  Musculoskeletal: Negative for neck pain.  Neurological: Negative for dizziness, syncope and headaches.  Hematological: Bruises/bleeds easily.  All other systems reviewed and are negative.   Allergies  Review of patient's allergies indicates no known allergies.  Home Medications  Prior to Admission medications   Medication Sig Start Date End Date Taking? Authorizing Provider  acetaminophen (TYLENOL) 500 MG tablet Take 500 mg by mouth every 6 (six) hours as needed for pain.   Yes Historical Provider, MD  buPROPion (WELLBUTRIN SR) 150 MG 12 hr tablet Take 150 mg by mouth 2 (two) times daily.    Yes Historical Provider, MD  Cholecalciferol 1000 UNITS tablet Take 1,000 Units by mouth daily.    Yes Historical Provider, MD  clopidogrel (PLAVIX) 75 MG tablet Take 75 mg by mouth daily after breakfast.    Yes Historical Provider, MD  CRESTOR 20 MG tablet Take 20 mg by mouth daily.  01/05/14  Yes Historical Provider, MD  divalproex (DEPAKOTE) 125 MG DR tablet Take 125 mg by mouth at bedtime.    Yes Historical  Provider, MD  enalapril (VASOTEC) 5 MG tablet Take 10 mg by mouth daily.  01/06/14  Yes Historical Provider, MD  levothyroxine (SYNTHROID, LEVOTHROID) 150 MCG tablet Take 150 mcg by mouth daily before breakfast.   Yes Historical Provider, MD  LORazepam (ATIVAN) 0.5 MG tablet Take 1 mg by mouth 2 (two) times daily.    Yes Historical Provider, MD  menthol-cetylpyridinium (CEPACOL) 3 MG lozenge Take 1 lozenge by mouth as needed for sore throat.   Yes Historical Provider, MD  metoprolol succinate (TOPROL-XL) 25 MG 24 hr tablet Take 25 mg by mouth daily.   Yes Historical Provider, MD  mineral oil external liquid Place 2 drops into both ears once a week. On Wednesday   Yes Historical Provider, MD  Multiple Vitamins-Minerals (CERTA-VITE SENIOR-LUTEIN PO) Take 1 tablet by mouth daily.   Yes Historical Provider, MD  sertraline (ZOLOFT) 50 MG tablet Take 50 mg by mouth every evening.    Yes Historical Provider, MD  docusate sodium (COLACE) 100 MG capsule Take 1 capsule (100 mg total) by mouth 2 (two) times daily. Patient not taking: Reported on 08/26/2015 04/29/14   Bradly BienenstockFred Ortmann, MD  oxyCODONE-acetaminophen (ROXICET) 5-325 MG per tablet Take 1 tablet by mouth every 4 (four) hours as needed for severe pain. Patient not taking: Reported on 08/26/2015 04/29/14   Bradly BienenstockFred Ortmann, MD  vitamin C (ASCORBIC ACID) 500 MG tablet Take 1 tablet (500 mg total) by mouth daily. Patient not taking: Reported on 08/26/2015 04/29/14   Bradly BienenstockFred Ortmann, MD   BP 123/72 mmHg  Pulse 77  Temp(Src) 97.9 F (36.6 C) (Oral)  Resp 16  SpO2 97%  Vital signs normal   Physical Exam  Constitutional: She is oriented to person, place, and time. She appears well-developed and well-nourished.  Non-toxic appearance. She does not appear ill. No distress.  HENT:  Head: Normocephalic and atraumatic.  Right Ear: External ear normal.  Left Ear: External ear normal.  Nose: Nose normal. No mucosal edema or rhinorrhea.  Mouth/Throat: Oropharynx is  clear and moist and mucous membranes are normal. No dental abscesses or uvula swelling.  Pt has no bruising of her scalp and no tenderness to palpation of her head.   Eyes: Conjunctivae and EOM are normal. Pupils are equal, round, and reactive to light.  Neck: Normal range of motion and full passive range of motion without pain. Neck supple.  nontender  Cardiovascular: Normal rate, regular rhythm and normal heart sounds.  Exam reveals no gallop and no friction rub.   No murmur heard. Pulmonary/Chest: Effort normal and breath sounds normal. No respiratory distress. She has no wheezes. She has no rhonchi. She has no rales. She  exhibits no tenderness and no crepitus.  Abdominal: Soft. Normal appearance and bowel sounds are normal. She exhibits no distension. There is no tenderness. There is no rebound and no guarding.  Musculoskeletal: Normal range of motion. She exhibits no edema or tenderness.  Moves all extremities well. Pt is ambulatory without difficulty in her room  Neurological: She is alert and oriented to person, place, and time. She has normal strength. No cranial nerve deficit.  Strong equal grips.  Skin: Skin is warm, dry and intact. No rash noted. No erythema. No pallor.  Psychiatric: She has a normal mood and affect. Her speech is normal and behavior is normal. Her mood appears not anxious.  Nursing note and vitals reviewed.   ED Course  Procedures  DIAGNOSTIC STUDIES: Oxygen Saturation is 97% on RA, normal by my interpretation.    COORDINATION OF CARE: 4:00 AM - Discussed plans to discharge. Advised to observe pt and check whether she is functioning normally every 2 hours. Pt advised of plan for treatment and pt agrees.  I talked to the patient and her sister who is a retired Engineer, civil (consulting). Patient has no symptoms from her fall including headache. We discussed doing a CT however even with the CT she would still need someone to do neuro checks every 2 hours. They prefer to not do the CT  scan and her sister has volunteered to call her every 2 hours to make sure she is okay. They understand because of her Plavix she is at increased risk of having a bleeding problem. Her sister states she has had a subdural hematoma before and they understand.    MDM   Final diagnoses:  Fall at nursing home, initial encounter  Contusion of head, initial encounter   Plan discharge  Devoria Albe, MD, Armando Gang'   I personally performed the services described in this documentation, which was scribed in my presence. The recorded information has been reviewed and considered.  Devoria Albe, MD, Concha Pyo, MD 08/26/15 [phone]

## 2016-01-09 ENCOUNTER — Encounter (HOSPITAL_COMMUNITY): Payer: Self-pay | Admitting: Emergency Medicine

## 2016-01-09 ENCOUNTER — Emergency Department (HOSPITAL_COMMUNITY)
Admission: EM | Admit: 2016-01-09 | Discharge: 2016-01-09 | Disposition: A | Discharge status: Home / Self Care | Payer: Medicare Other | Attending: Emergency Medicine | Admitting: Emergency Medicine

## 2016-01-09 ENCOUNTER — Emergency Department (HOSPITAL_COMMUNITY): Payer: Medicare Other

## 2016-01-09 DIAGNOSIS — Y998 Other external cause status: Secondary | ICD-10-CM | POA: Insufficient documentation

## 2016-01-09 DIAGNOSIS — Y9289 Other specified places as the place of occurrence of the external cause: Secondary | ICD-10-CM | POA: Insufficient documentation

## 2016-01-09 DIAGNOSIS — S0081XA Abrasion of other part of head, initial encounter: Secondary | ICD-10-CM | POA: Insufficient documentation

## 2016-01-09 DIAGNOSIS — F329 Major depressive disorder, single episode, unspecified: Secondary | ICD-10-CM | POA: Diagnosis not present

## 2016-01-09 DIAGNOSIS — R2689 Other abnormalities of gait and mobility: Secondary | ICD-10-CM | POA: Diagnosis not present

## 2016-01-09 DIAGNOSIS — Y9389 Activity, other specified: Secondary | ICD-10-CM | POA: Diagnosis not present

## 2016-01-09 DIAGNOSIS — Z86718 Personal history of other venous thrombosis and embolism: Secondary | ICD-10-CM | POA: Diagnosis not present

## 2016-01-09 DIAGNOSIS — W19XXXA Unspecified fall, initial encounter: Secondary | ICD-10-CM

## 2016-01-09 DIAGNOSIS — Z8781 Personal history of (healed) traumatic fracture: Secondary | ICD-10-CM | POA: Diagnosis not present

## 2016-01-09 DIAGNOSIS — E119 Type 2 diabetes mellitus without complications: Secondary | ICD-10-CM | POA: Diagnosis not present

## 2016-01-09 DIAGNOSIS — Z7902 Long term (current) use of antithrombotics/antiplatelets: Secondary | ICD-10-CM | POA: Insufficient documentation

## 2016-01-09 DIAGNOSIS — Z8673 Personal history of transient ischemic attack (TIA), and cerebral infarction without residual deficits: Secondary | ICD-10-CM | POA: Insufficient documentation

## 2016-01-09 DIAGNOSIS — R32 Unspecified urinary incontinence: Secondary | ICD-10-CM | POA: Diagnosis not present

## 2016-01-09 DIAGNOSIS — S40022A Contusion of left upper arm, initial encounter: Secondary | ICD-10-CM | POA: Diagnosis not present

## 2016-01-09 DIAGNOSIS — W06XXXA Fall from bed, initial encounter: Secondary | ICD-10-CM | POA: Diagnosis not present

## 2016-01-09 DIAGNOSIS — E785 Hyperlipidemia, unspecified: Secondary | ICD-10-CM | POA: Insufficient documentation

## 2016-01-09 DIAGNOSIS — R2681 Unsteadiness on feet: Secondary | ICD-10-CM

## 2016-01-09 DIAGNOSIS — E039 Hypothyroidism, unspecified: Secondary | ICD-10-CM | POA: Insufficient documentation

## 2016-01-09 DIAGNOSIS — Z79899 Other long term (current) drug therapy: Secondary | ICD-10-CM | POA: Insufficient documentation

## 2016-01-09 DIAGNOSIS — S6992XA Unspecified injury of left wrist, hand and finger(s), initial encounter: Secondary | ICD-10-CM | POA: Diagnosis present

## 2016-01-09 LAB — COMPREHENSIVE METABOLIC PANEL
ALT: 24 U/L (ref 14–54)
ANION GAP: 9 (ref 5–15)
AST: 28 U/L (ref 15–41)
Albumin: 3.8 g/dL (ref 3.5–5.0)
Alkaline Phosphatase: 76 U/L (ref 38–126)
BILIRUBIN TOTAL: 0.8 mg/dL (ref 0.3–1.2)
BUN: 14 mg/dL (ref 6–20)
CALCIUM: 9.3 mg/dL (ref 8.9–10.3)
CO2: 25 mmol/L (ref 22–32)
Chloride: 107 mmol/L (ref 101–111)
Creatinine, Ser: 0.87 mg/dL (ref 0.44–1.00)
GFR calc non Af Amer: >60 mL/min (ref >60)
GLUCOSE: 160 mg/dL — AB (ref 65–99)
POTASSIUM: 4 mmol/L (ref 3.5–5.1)
Sodium: 141 mmol/L (ref 135–145)
Total Protein: 7.1 g/dL (ref 6.5–8.1)

## 2016-01-09 LAB — URINALYSIS, ROUTINE W REFLEX MICROSCOPIC
Bilirubin Urine: NEGATIVE
GLUCOSE, UA: NEGATIVE mg/dL
Hgb urine dipstick: NEGATIVE
Ketones, ur: NEGATIVE mg/dL
NITRITE: NEGATIVE
PH: 6 (ref 5.0–8.0)
Protein, ur: NEGATIVE mg/dL
SPECIFIC GRAVITY, URINE: 1.017 (ref 1.005–1.030)

## 2016-01-09 LAB — CBC WITH DIFFERENTIAL/PLATELET
BASOS ABS: 0 10*3/uL (ref 0.0–0.1)
BASOS PCT: 0 %
Eosinophils Absolute: 0.1 10*3/uL (ref 0.0–0.7)
Eosinophils Relative: 1 %
HEMATOCRIT: 36.7 % (ref 36.0–46.0)
HEMOGLOBIN: 12.1 g/dL (ref 12.0–15.0)
LYMPHS PCT: 21 %
Lymphs Abs: 1.8 10*3/uL (ref 0.7–4.0)
MCH: 31 pg (ref 26.0–34.0)
MCHC: 33 g/dL (ref 30.0–36.0)
MCV: 94.1 fL (ref 78.0–100.0)
MONOS PCT: 12 %
Monocytes Absolute: 1 10*3/uL (ref 0.1–1.0)
NEUTROS ABS: 5.5 10*3/uL (ref 1.7–7.7)
NEUTROS PCT: 66 %
Platelets: 187 10*3/uL (ref 150–400)
RBC: 3.9 MIL/uL (ref 3.87–5.11)
RDW: 14.1 % (ref 11.5–15.5)
WBC: 8.3 10*3/uL (ref 4.0–10.5)

## 2016-01-09 LAB — URINE MICROSCOPIC-ADD ON

## 2016-01-09 NOTE — ED Notes (Signed)
Bed: WA09 Expected date:  Expected time:  Means of arrival:  Comments: EMS 18F fall out of bed LUE pain

## 2016-01-09 NOTE — ED Provider Notes (Signed)
CSN: [id number]     Arrival date & time 01/09/16  0227 History   First MD Initiated Contact with Patient 01/09/16 [phone]     Chief Complaint  Patient presents with  . Fall  . Urinary Incontinence     (Consider location/radiation/quality/duration/timing/severity/associated sxs/prior Treatment) HPI Patient reportedly slid off of her bed at about 10 PM. There was no associated injury. No loss of consciousness. Patient is not reporting any pain. She also had a fall 2 days earlier. She did have some abrasions from that fall. She denies however having problems with pain or change in mobility. Only symptom she identifies is urination when she coughs. She denies however any pain burning or urgency with urination. There've been no documented fevers, mental status change from baseline, respiratory symptoms or change in baseline function. Past Medical History  Diagnosis Date  . Hypothyroidism   . Depression   . Hyperlipidemia   . 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 Gait disorder   . Short-term memory loss     post  cva  in 05/ 2000  . Type 2 diabetes mellitus (HCC)   . Radius fracture     left wrist  . Stroke (HCC)   . Blood clots in brain    Past Surgical History  Procedure Laterality Date  . Tubal ligation    . Wisdom tooth extraction    . Tonsillectomy    . Subdrual hematoma  2000    as a result of the stroke   . Cholecystectomy  11/09/2011    Procedure: LAPAROSCOPIC CHOLECYSTECTOMY WITH INTRAOPERATIVE CHOLANGIOGRAM;  Surgeon: Velora Heckler, MD;  Location: [facility];  Service: General;  Laterality: N/A;  . Benign breast bx    . Subdural hematoma evacuation via craniotomy  05/  2000  . Open reduction internal fixation (orif) distal radial fracture Left 04/29/2014    Procedure: OPEN REDUCTION INTERNAL FIXATION (ORIF) DISTAL RADIAL FRACTURE;  Surgeon: Sharma Covert, MD;  Location: [facility];  Service: Orthopedics;  Laterality: Left;   History reviewed. No pertinent family history. Social History  Substance Use Topics  . Smoking status: Never Smoker   . Smokeless tobacco: Never Used  . Alcohol Use: No   OB History    No data available     Review of Systems 10 Systems reviewed and are negative for acute change except as noted in the HPI.    Allergies  Review of patient's allergies indicates no known allergies.  Home Medications   Prior to Admission medications   Medication Sig Start Date End Date Taking? Authorizing Provider  acetaminophen (TYLENOL) 500 MG tablet Take 500 mg by mouth every 6 (six) hours as needed for pain.   Yes Historical Provider, MD  buPROPion (WELLBUTRIN SR) 150 MG 12 hr tablet Take 150 mg by mouth 2 (two) times daily.    Yes Historical Provider, MD  Cholecalciferol 1000 UNITS tablet Take 1,000 Units by mouth daily.    Yes Historical Provider, MD  clopidogrel (PLAVIX) 75 MG tablet Take 75 mg by mouth daily after breakfast.    Yes Historical Provider, MD  CRESTOR 20 MG tablet Take 20 mg by mouth daily.  01/05/14  Yes Historical Provider, MD  divalproex (DEPAKOTE) 125 MG DR tablet Take 125 mg by mouth at bedtime.    Yes Historical Provider, MD  enalapril (VASOTEC) 5 MG tablet Take 5 mg by mouth daily.  01/06/14  Yes Historical Provider, MD  levothyroxine (SYNTHROID, LEVOTHROID) 50 MCG tablet Take 50 mcg by mouth daily before breakfast.   Yes Historical Provider, MD  LORazepam (ATIVAN) 0.5 MG tablet Take 1 mg by mouth 2 (two) times daily.    Yes Historical Provider, MD  metoprolol succinate (TOPROL-XL) 25 MG 24 hr tablet Take 25 mg by mouth daily.   Yes Historical Provider, MD  mineral oil external liquid Place 2 drops into both ears once a week. On Wednesday   Yes Historical Provider, MD  Multiple Vitamins-Minerals (CERTA-VITE SENIOR-LUTEIN PO) Take 1 tablet by mouth daily.   Yes Historical Provider, MD   OVER THE COUNTER MEDICATION Mix 1 tbsp white vinegar with 1 pint warm water .Wash perineal area once weekly.   Yes Historical Provider, MD  sertraline (ZOLOFT) 50 MG tablet Take 50 mg by mouth every evening.    Yes Historical Provider, MD  docusate sodium (COLACE) 100 MG capsule Take 1 capsule (100 mg total) by mouth 2 (two) times daily. Patient not taking: Reported on 08/26/2015 04/29/14   Bradly BienenstockFred Ortmann, MD  menthol-cetylpyridinium (CEPACOL) 3 MG lozenge Take 1 lozenge by mouth as needed for sore throat.    Historical Provider, MD  oxyCODONE-acetaminophen (ROXICET) 5-325 MG per tablet Take 1 tablet by mouth every 4 (four) hours as needed for severe pain. Patient not taking: Reported on 08/26/2015 04/29/14   Bradly BienenstockFred Ortmann, MD  vitamin C (ASCORBIC ACID) 500 MG tablet Take 1 tablet (500 mg total) by mouth daily. Patient not taking: Reported on 08/26/2015 04/29/14   Bradly BienenstockFred Ortmann, MD   BP 118/59 mmHg  Pulse 80  Temp(Src) 99.9 F (37.7 C) (Oral)  Resp 18  SpO2 97% Physical Exam  Constitutional: She is oriented to person, place, and time. She appears well-developed and well-nourished.  HENT:  Head: Normocephalic and atraumatic.  Minor facial abrasion without laceration to right brow. No periorbital hematoma.  Eyes: EOM are normal. Pupils are equal, round, and reactive to light.  Neck: Neck supple.  Cardiovascular: Normal rate, regular rhythm, normal heart sounds and intact distal pulses.   Pulmonary/Chest: Effort normal and breath sounds normal.  Abdominal: Soft. Bowel sounds are normal. She exhibits no distension. There is no tenderness.  Musculoskeletal: Normal range of motion. She exhibits no edema.  Contusion left upper extremity without deformity or decreased range of motion.  Neurological: She is alert and oriented to person, place, and time. She has normal strength. She exhibits normal muscle tone. Coordination normal. GCS eye subscore is 4. GCS verbal subscore is 5. GCS motor subscore is  6.  Skin: Skin is warm, dry and intact.  Psychiatric: She has a normal mood and affect.    ED Course  Procedures (including critical care time) Labs Review Labs Reviewed  COMPREHENSIVE METABOLIC PANEL - Abnormal; Notable for the following:    Glucose, Bld 160 (*)    All other components within normal limits  URINALYSIS, ROUTINE W REFLEX MICROSCOPIC (NOT AT [facility]) - Abnormal; Notable for the following:    Leukocytes, UA SMALL (*)    All other components within normal limits  URINE MICROSCOPIC-ADD ON - Abnormal; Notable for the following:    Squamous Epithelial / LPF 0-5 (*)    Bacteria, UA RARE (*)    All other components within normal limits  URINE CULTURE  CBC WITH DIFFERENTIAL/PLATELET    Imaging Review No results found. I have personally reviewed and  evaluated these images and lab results as part of my medical decision-making.   EKG Interpretation None      MDM   Final diagnoses:  Fall, initial encounter  Arm contusion, left, initial encounter  Facial abrasion, initial encounter  Gait instability   Patient had fall 2 days earlier and did have a minor facial abrasion. Due to increased recent falls and facial abrasion, CT head was obtained and found to have no acute findings. Patient is alert and interactive. No signs of acute intercurrent illness. At this time I do feel she is safe for return to [facility] care.    Arby Barrette, MD 01/14/16 [phone]

## 2016-01-09 NOTE — Discharge Instructions (Signed)
Contusion °A contusion is a deep bruise. Contusions are the result of a blunt injury to tissues and muscle fibers under the skin. The injury causes bleeding under the skin. The skin overlying the contusion may turn blue, purple, or yellow. Minor injuries will give you a painless contusion, but more severe contusions may stay painful and swollen for a few weeks.  °CAUSES  °This condition is usually caused by a blow, trauma, or direct force to an area of the body. °SYMPTOMS  °Symptoms of this condition include: °· Swelling of the injured area. °· Pain and tenderness in the injured area. °· Discoloration. The area may have redness and then turn blue, purple, or yellow. °DIAGNOSIS  °This condition is diagnosed based on a physical exam and medical history. An X-ray, CT scan, or MRI may be needed to determine if there are any associated injuries, such as broken bones (fractures). °TREATMENT  °Specific treatment for this condition depends on what area of the body was injured. In general, the best treatment for a contusion is resting, icing, applying pressure to (compression), and elevating the injured area. This is often called the RICE strategy. Over-the-counter anti-inflammatory medicines may also be recommended for pain control.  °HOME CARE INSTRUCTIONS  °· Rest the injured area. °· If directed, apply ice to the injured area: °¨ Put ice in a plastic bag. °¨ Place a towel between your skin and the bag. °¨ Leave the ice on for 20 minutes, 2-3 times per day. °· If directed, apply light compression to the injured area using an elastic bandage. Make sure the bandage is not wrapped too tightly. Remove and reapply the bandage as directed by your health care provider. °· If possible, raise (elevate) the injured area above the level of your heart while you are sitting or lying down. °· Take over-the-counter and prescription medicines only as told by your health care provider. °SEEK MEDICAL CARE IF: °· Your symptoms do not  improve after several days of treatment. °· Your symptoms get worse. °· You have difficulty moving the injured area. °SEEK IMMEDIATE MEDICAL CARE IF:  °· You have severe pain. °· You have numbness in a hand or foot. °· Your hand or foot turns pale or cold. °  °This information is not intended to replace advice given to you by your health care provider. Make sure you discuss any questions you have with your health care provider. °  °Document Released: 08/02/2005 Document Revised: 07/14/2015 Document Reviewed: 03/10/2015 °Elsevier Interactive Patient Education ©2016 [employer] ° °Abrasion °An abrasion is a cut or scrape on the outer surface of your skin. An abrasion does not extend through all of the layers of your skin. It is important to care for your abrasion properly to prevent infection. °CAUSES °Most abrasions are caused by falling on or gliding across the ground or another surface. When your skin rubs on something, the outer and inner layer of skin rubs off.  °SYMPTOMS °A cut or scrape is the main symptom of this condition. The scrape may be bleeding, or it may appear red or pink. If there was an associated fall, there may be an underlying bruise. °DIAGNOSIS °An abrasion is diagnosed with a physical exam. °TREATMENT °Treatment for this condition depends on how large and deep the abrasion is. Usually, your abrasion will be cleaned with water and mild soap. This removes any dirt or debris that may be stuck. An antibiotic ointment may be applied to the abrasion to help prevent infection. A bandage (  dressing) may be placed on the abrasion to keep it clean. °You may also need a tetanus shot. °HOME CARE INSTRUCTIONS °Medicines °· Take or apply medicines only as directed by your health care provider. °· If you were prescribed an antibiotic ointment, finish all of it even if you start to feel better. °Wound Care °· Clean the wound with mild soap and water 2-3 times per day or as directed by your health care  provider. Pat your wound dry with a clean towel. Do not rub it. °· There are many different ways to close and cover a wound. Follow instructions from your health care provider about: °¨ Wound care. °¨ Dressing changes and removal. °· Check your wound every day for signs of infection. Watch for: °¨ Redness, swelling, or pain. °¨ Fluid, blood, or pus. °General Instructions °· Keep the dressing dry as directed by your health care provider. Do not take baths, swim, use a hot tub, or do anything that would put your wound underwater until your health care provider approves. °· If there is swelling, raise (elevate) the injured area above the level of your heart while you are sitting or lying down. °· Keep all follow-up visits as directed by your health care provider. This is important. °SEEK MEDICAL CARE IF: °· You received a tetanus shot and you have swelling, severe pain, redness, or bleeding at the injection site. °· Your pain is not controlled with medicine. °· You have increased redness, swelling, or pain at the site of your wound. °SEEK IMMEDIATE MEDICAL CARE IF: °· You have a red streak going away from your wound. °· You have a fever. °· You have fluid, blood, or pus coming from your wound. °· You notice a bad smell coming from your wound or your dressing. °  °This information is not intended to replace advice given to you by your health care provider. Make sure you discuss any questions you have with your health care provider. °  °Document Released: 08/02/2005 Document Revised: 07/14/2015 Document Reviewed: 10/21/2014 °Elsevier Interactive Patient Education ©2016 Elsevier Inc. ° °

## 2016-01-09 NOTE — ED Notes (Signed)
Brought in by EMS from [facility] with c/o fall and urinary frequency.  Pt reports that she has had a fall tonight--- slid off her bed at 2200; also had a recent fall on 01/07/2016----- no s/s apparent injury from these falls.  However, pt reports that "every time she coughs, she urinates".  Pt denies dysuria.  Per EMS, pt has requested to be sent here for further evaluation and treatment despite SNF staff advise not to.  Arrived to ED A/Ox3, denies pain and in no s/s distress.

## 2016-01-11 LAB — URINE CULTURE

## 2016-03-03 ENCOUNTER — Other Ambulatory Visit: Payer: Self-pay

## 2016-03-03 DIAGNOSIS — Z1231 Encounter for screening mammogram for malignant neoplasm of breast: Secondary | ICD-10-CM

## 2016-03-22 ENCOUNTER — Ambulatory Visit
Admission: RE | Admit: 2016-03-22 | Discharge: 2016-03-22 | Disposition: A | Discharge status: Home / Self Care | Payer: Medicare Other | Source: Ambulatory Visit

## 2016-03-22 DIAGNOSIS — Z1231 Encounter for screening mammogram for malignant neoplasm of breast: Secondary | ICD-10-CM

## 2017-03-19 ENCOUNTER — Other Ambulatory Visit: Payer: Self-pay | Admitting: Nurse Practitioner

## 2017-03-19 ENCOUNTER — Other Ambulatory Visit: Payer: Self-pay | Admitting: Internal Medicine

## 2017-03-19 DIAGNOSIS — Z1231 Encounter for screening mammogram for malignant neoplasm of breast: Secondary | ICD-10-CM

## 2017-04-12 ENCOUNTER — Ambulatory Visit
Admission: RE | Admit: 2017-04-12 | Discharge: 2017-04-12 | Disposition: A | Discharge status: Home / Self Care | Payer: Medicare Other | Source: Ambulatory Visit | Attending: Nurse Practitioner | Admitting: Nurse Practitioner

## 2017-04-12 DIAGNOSIS — Z1231 Encounter for screening mammogram for malignant neoplasm of breast: Secondary | ICD-10-CM

## 2017-08-06 ENCOUNTER — Emergency Department (HOSPITAL_COMMUNITY)
Admission: EM | Admit: 2017-08-06 | Discharge: 2017-08-06 | Disposition: A | Discharge status: Home / Self Care | Payer: Medicare Other | Attending: Emergency Medicine | Admitting: Emergency Medicine

## 2017-08-06 DIAGNOSIS — R51 Headache: Secondary | ICD-10-CM | POA: Diagnosis present

## 2017-08-06 DIAGNOSIS — Z5321 Procedure and treatment not carried out due to patient leaving prior to being seen by health care provider: Secondary | ICD-10-CM | POA: Insufficient documentation

## 2017-08-06 NOTE — ED Notes (Signed)
Pt presented to the ED with her son after a fall at an assisted living facility. She had a hematoma to the forehead and had bit her tongue, which was not bleeding.  After checking in, vitals were assessed. The son was talking on the phone to a family member who was a Engineer, civil (consulting). They decided not to wait to be seen.

## 2018-04-15 ENCOUNTER — Other Ambulatory Visit: Payer: Self-pay

## 2018-04-15 ENCOUNTER — Emergency Department (HOSPITAL_COMMUNITY)
Admission: EM | Admit: 2018-04-15 | Discharge: 2018-04-15 | Disposition: A | Discharge status: Home / Self Care | Payer: Medicare Other | Attending: Emergency Medicine | Admitting: Emergency Medicine

## 2018-04-15 ENCOUNTER — Encounter (HOSPITAL_COMMUNITY): Payer: Self-pay

## 2018-04-15 ENCOUNTER — Emergency Department (HOSPITAL_COMMUNITY): Payer: Medicare Other

## 2018-04-15 DIAGNOSIS — Z7902 Long term (current) use of antithrombotics/antiplatelets: Secondary | ICD-10-CM | POA: Insufficient documentation

## 2018-04-15 DIAGNOSIS — Z7984 Long term (current) use of oral hypoglycemic drugs: Secondary | ICD-10-CM | POA: Diagnosis not present

## 2018-04-15 DIAGNOSIS — Y999 Unspecified external cause status: Secondary | ICD-10-CM | POA: Insufficient documentation

## 2018-04-15 DIAGNOSIS — Y92129 Unspecified place in nursing home as the place of occurrence of the external cause: Secondary | ICD-10-CM | POA: Insufficient documentation

## 2018-04-15 DIAGNOSIS — E119 Type 2 diabetes mellitus without complications: Secondary | ICD-10-CM | POA: Diagnosis not present

## 2018-04-15 DIAGNOSIS — W19XXXA Unspecified fall, initial encounter: Secondary | ICD-10-CM | POA: Diagnosis not present

## 2018-04-15 DIAGNOSIS — Z79899 Other long term (current) drug therapy: Secondary | ICD-10-CM | POA: Diagnosis not present

## 2018-04-15 DIAGNOSIS — E039 Hypothyroidism, unspecified: Secondary | ICD-10-CM | POA: Insufficient documentation

## 2018-04-15 DIAGNOSIS — Y9301 Activity, walking, marching and hiking: Secondary | ICD-10-CM | POA: Insufficient documentation

## 2018-04-15 DIAGNOSIS — S0990XA Unspecified injury of head, initial encounter: Secondary | ICD-10-CM | POA: Diagnosis not present

## 2018-04-15 LAB — URINALYSIS, ROUTINE W REFLEX MICROSCOPIC
Bilirubin Urine: NEGATIVE
Glucose, UA: NEGATIVE mg/dL
Hgb urine dipstick: NEGATIVE
KETONES UR: NEGATIVE mg/dL
NITRITE: NEGATIVE
Protein, ur: NEGATIVE mg/dL
Specific Gravity, Urine: 1.017 (ref 1.005–1.030)
pH: 5 (ref 5.0–8.0)

## 2018-04-15 LAB — CBC WITH DIFFERENTIAL/PLATELET
BASOS PCT: 0 %
Basophils Absolute: 0 10*3/uL (ref 0.0–0.1)
Eosinophils Absolute: 0.1 10*3/uL (ref 0.0–0.7)
Eosinophils Relative: 1 %
HEMATOCRIT: 38.8 % (ref 36.0–46.0)
HEMOGLOBIN: 12.7 g/dL (ref 12.0–15.0)
LYMPHS ABS: 2.5 10*3/uL (ref 0.7–4.0)
Lymphocytes Relative: 32 %
MCH: 30.6 pg (ref 26.0–34.0)
MCHC: 32.7 g/dL (ref 30.0–36.0)
MCV: 93.5 fL (ref 78.0–100.0)
MONO ABS: 0.8 10*3/uL (ref 0.1–1.0)
MONOS PCT: 10 %
NEUTROS ABS: 4.5 10*3/uL (ref 1.7–7.7)
Neutrophils Relative %: 57 %
Platelets: 238 10*3/uL (ref 150–400)
RBC: 4.15 MIL/uL (ref 3.87–5.11)
RDW: 13.4 % (ref 11.5–15.5)
WBC: 7.9 10*3/uL (ref 4.0–10.5)

## 2018-04-15 LAB — BASIC METABOLIC PANEL
ANION GAP: 9 (ref 5–15)
BUN: 13 mg/dL (ref 6–20)
CHLORIDE: 106 mmol/L (ref 101–111)
CO2: 24 mmol/L (ref 22–32)
CREATININE: 0.87 mg/dL (ref 0.44–1.00)
Calcium: 9.7 mg/dL (ref 8.9–10.3)
GFR calc Af Amer: >60 mL/min (ref >60)
GFR calc non Af Amer: >60 mL/min (ref >60)
GLUCOSE: 102 mg/dL — AB (ref 65–99)
Potassium: 3.9 mmol/L (ref 3.5–5.1)
Sodium: 139 mmol/L (ref 135–145)

## 2018-04-15 MED ORDER — CEPHALEXIN 500 MG PO CAPS: 500.0000 mg | ORAL_CAPSULE | Freq: Two times a day (BID) | ORAL | 0 refills | Status: AC

## 2018-04-15 NOTE — ED Triage Notes (Signed)
Pt to ED via [facility] with complaints of a witnessed fall at the facility. Pt stated that she was "going a little too fast for her own good and had trip and fell on her left side". Pt has a hx of CVA and is on Plavix. Pt has a left sided hematoma on her elbow and states that she has pain on her left side.  Pt does not complain of any blurry vision or pain at this moment. Pt does have some soft spots on her head from previous stroke.

## 2018-04-15 NOTE — ED Notes (Signed)
Bed: [id number] Expected date:  Expected time:  Means of arrival:  Comments: 72 yo F/ Fall-SNF

## 2018-04-15 NOTE — Discharge Instructions (Addendum)
Nice to meet you Ms. Brittney Moss.  All your blood counts and electrolytes look normal. The scans of your head and neck and your elbow all look good with no fractures or dislocation. Your urine shows some signs of infection and is something we can easily treat in case that is contributing to your sx over the last several days. Be sure to follow up with your primary doctor or the doctor at the facility to consider other factors like thyroid, mood. One medication in particular that could be contributing to falls, confusion, fatigue is the lorazepam. Talking with your doctor about decreasing or stopping this medicine may also help decrease the risk of more falls and confusion. Be careful and use assistance while moving around!

## 2018-04-15 NOTE — ED Notes (Signed)
Delay in vitals PT in  CT and xray.

## 2018-04-15 NOTE — ED Notes (Signed)
Spoke with Amy from lab, she said she would add urine culture.

## 2018-04-15 NOTE — ED Provider Notes (Signed)
[facility]-EMERGENCY DEPT Provider Note   CSN: [id number] Arrival date & time: 04/15/18  1947  History   Chief Complaint Chief Complaint  Patient presents with  . Fall    HPI Brittney Moss is a 72 y.o. female with past medical history of prior CVA, memory issues, hypothyroidism, diabetes, depression, hyperlipidemia, left radial fracture who presented to the ED after a fall.  Patient resides in a living facility and had a witnessed fall earlier today while walking down the hallway.  Her two sisters are present and assist with portions of history.  They state she has been more fatigued with a decrease in her baseline functioning with ADLs over the last 2-3 days and has had more difficulty walking.  At baseline, uses a walker for ambulatory  assistance.  They states she was attempting to walk today without further assistance and suffered a fall onto her left side and believe she hit her head. No reports of LOC either before or after the fall. Patient reports left elbow pain, otherwise no complaints and does not endorse any prodromal sx prior to fall.   Past Medical History:  Diagnosis Date  . Blood clots in brain   . Depression   . Gait disorder   . 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 Hyperlipidemia   . Hypothyroidism   . Radius fracture    left wrist  . Short-term memory loss    post  cva  in 05/ 2000  . Stroke (HCC)   . Type 2 diabetes mellitus [facility])     Patient Active Problem List   Diagnosis Date Noted  . Fracture of left distal radius 04/29/2014  . Gait disorder 07/31/2013  . Cognitive decline 07/31/2013  . Cholelithiasis with cholecystitis 10/17/2011  . RASH-NONVESICULAR 09/05/2007  . CELLULITIS/ABSCESS, FACE 07/23/2007  . HYPERLIPIDEMIA 07/02/2007  . DEPRESSION 07/02/2007  . HYPOTHYROIDISM 03/21/2007  . CVA  03/21/2007  . CRANIOTOMY, HX OF 03/21/2007  . DILATION AND CURETTAGE, HX OF 03/21/2007    Past Surgical History:  Procedure Laterality Date  . BENIGN BREAST BX    . BREAST EXCISIONAL BIOPSY Right   . CHOLECYSTECTOMY  11/09/2011   Procedure: LAPAROSCOPIC CHOLECYSTECTOMY WITH INTRAOPERATIVE CHOLANGIOGRAM;  Surgeon: Velora Heckler, MD;  Location: [facility];  Service: General;  Laterality: N/A;  . OPEN REDUCTION INTERNAL FIXATION (ORIF) DISTAL RADIAL FRACTURE Left 04/29/2014   Procedure: OPEN REDUCTION INTERNAL FIXATION (ORIF) DISTAL RADIAL FRACTURE;  Surgeon: Sharma Covert, MD;  Location: [facility];  Service: Orthopedics;  Laterality: Left;  . subdrual hematoma  2000   as a result of the stroke   . SUBDURAL HEMATOMA EVACUATION VIA CRANIOTOMY  05/  2000  . TONSILLECTOMY    . TUBAL LIGATION    . WISDOM TOOTH EXTRACTION       OB History   None      Home Medications    Prior to Admission medications   Medication Sig Start Date End Date Taking? Authorizing Provider  acetaminophen (TYLENOL) 500 MG tablet Take 500 mg by mouth every 6 (six) hours as needed for pain.   Yes [provider]  buPROPion (WELLBUTRIN SR) 150 MG 12 hr tablet Take 150 mg by mouth 2 (two) times daily.    Yes [provider]  Cholecalciferol 1000 UNITS tablet Take 2,000 Units by  mouth daily.    Yes [provider]  clopidogrel (PLAVIX) 75 MG tablet Take 75 mg by mouth daily after breakfast.    Yes [provider]  CRESTOR 20 MG tablet Take 20 mg by mouth at bedtime.  01/05/14  Yes [provider]  enalapril (VASOTEC) 5 MG tablet Take 5 mg by mouth daily.  01/06/14  Yes [provider]  fenofibrate (TRICOR) 48 MG tablet Take 48 mg by mouth daily.  03/17/18  Yes [provider]  levothyroxine (SYNTHROID, LEVOTHROID) 100 MCG tablet Take 100 mcg by mouth daily before breakfast.  03/24/18  Yes [provider]  loperamide (IMODIUM) 2 MG capsule  Take 4 mg by mouth daily as needed for diarrhea or loose stools.  04/04/18  Yes [provider]  LORazepam (ATIVAN) 0.5 MG tablet Take 1 mg by mouth at bedtime.    Yes [provider]  metFORMIN (GLUCOPHAGE) 1000 MG tablet Take 1,000 mg by mouth 2 (two) times daily with a meal.  03/28/18  Yes [provider]  metoprolol succinate (TOPROL-XL) 25 MG 24 hr tablet Take 25 mg by mouth daily.   Yes [provider]  Multiple Vitamins-Minerals (CERTA-VITE SENIOR-LUTEIN PO) Take 1 tablet by mouth daily.   Yes [provider]  Multiple Vitamins-Minerals (HAIR SKIN AND NAILS FORMULA PO) Take 1 tablet by mouth 2 (two) times daily.   Yes [provider]  OVER THE COUNTER MEDICATION Mix 1 tbsp white vinegar with 1 pint warm water .Wash perineal area once weekly on Friday.   Yes [provider]  cephALEXin (KEFLEX) 500 MG capsule Take 1 capsule (500 mg total) by mouth 2 (two) times daily for 5 days. 04/15/18 04/20/18  Ginger Carne, MD  mineral oil external liquid Place 2 drops into both ears once a week. On Mondays    [provider]    Family History History reviewed. No pertinent family history.  Social History Social History   Tobacco Use  . Smoking status: Never Smoker  . Smokeless tobacco: Never Used  Substance Use Topics  . Alcohol use: No  . Drug use: No     Allergies   Patient has no known allergies.   Review of Systems Review of Systems  Constitutional: Positive for activity change and fatigue. Negative for chills and fever.  Respiratory: Negative for shortness of breath.   Cardiovascular: Negative for chest pain.  Gastrointestinal: Negative for abdominal pain and nausea.  Musculoskeletal:       Elbow pain   Neurological: Negative for dizziness and syncope.     Physical Exam Updated Vital Signs BP (!) 111/53 (BP Location: Left Arm)   Pulse 76   Temp 98.3 F (36.8 C) (Oral)   Resp 18   Ht 5\' 4"  (1.626 m)    Wt 88 kg (194 lb)   SpO2 99%   BMI 33.30 kg/m   General: Elderly female resting on ED stretcher comfortably, no acute distress Head: Small raised area of possible hematoma though near chronic appearing scar (does have h/o posterior craniotomy), otherwise normocephalic, atraumatic Eyes: Decreased downward gaze otherwise EOMI, PERRL  ENT: Moist mucus membranes, no pharyngeal exudate CV: RRR, no murmur appreciated  Resp: Clear breath sounds bilaterally, normal work of breathing, no distress  Abd: Soft, +BS, non-tender to palpation, obese  Extr: Hematoma and tenderness over L olecranon, full ROM of elbow, no tenderness to L upper or lower extremity otherwise.  Neuro: Alert and oriented to self and place, not  time. Full strength to bilateral upper and lower extremities, tongue and uvula midline, no facial asymmetry, sensation intact  Skin: Warm, dry     ED Treatments / Results  Labs (all labs ordered are listed, but only abnormal results are displayed) Labs Reviewed  BASIC METABOLIC PANEL - Abnormal; Notable for the following components:      Result Value   Glucose, Bld 102 (*)    All other components within normal limits  URINALYSIS, ROUTINE W REFLEX MICROSCOPIC - Abnormal; Notable for the following components:   APPearance HAZY (*)    Leukocytes, UA SMALL (*)    Bacteria, UA RARE (*)    All other components within normal limits  URINE CULTURE  CBC WITH DIFFERENTIAL/PLATELET    EKG None  Radiology Dg Elbow 2 Views Left  Result Date: 04/15/2018 CLINICAL DATA:  Fall with elbow hematoma EXAM: LEFT ELBOW - 2 VIEW COMPARISON:  None. FINDINGS: There is no evidence of fracture, dislocation, or joint effusion. There is no evidence of arthropathy or other focal bone abnormality. Mild soft tissue swelling. IMPRESSION: Mild soft tissue swelling of the left elbow without acute fracture. Electronically Signed   By: Deatra RobinsonKevin  Herman M.D.   On: 04/15/2018 22:31   Ct Head Wo Contrast  Result  Date: 04/15/2018 CLINICAL DATA:  Trip and fall landing on left side. Head trauma, headache; C-spine trauma, high clinical risk (NEXUS/CCR) EXAM: CT HEAD WITHOUT CONTRAST CT CERVICAL SPINE WITHOUT CONTRAST TECHNIQUE: Multidetector CT imaging of the head and cervical spine was performed following the standard protocol without intravenous contrast. Multiplanar CT image reconstructions of the cervical spine were also generated. COMPARISON:  Head CT 01/09/2016 FINDINGS: CT HEAD FINDINGS Brain: Stable degree of atrophy and chronic small vessel ischemia. Remote lacunar infarcts in bilateral basal ganglia. No intracranial hemorrhage, mass effect, or midline shift. No hydrocephalus. The basilar cisterns are patent. No evidence of territorial infarct or acute ischemia. No extra-axial or intracranial fluid collection. Vascular: No hyperdense vessel or unexpected calcification. Skull: Right parietal craniotomy. No skull fracture or focal lesion. Sinuses/Orbits: Paranasal sinuses and mastoid air cells are clear. The visualized orbits are unremarkable. Other: None. CT CERVICAL SPINE FINDINGS Alignment: Normal. Skull base and vertebrae: No acute fracture. Vertebral body heights are maintained. The dens and skull base are intact. Soft tissues and spinal canal: No prevertebral fluid or swelling. No visible canal hematoma. Disc levels: Disc space narrowing and endplate spurring most prominent at C5-C6 and C6-C7. Multilevel facet arthropathy. Bony ankylosis of left C3-C4 facet appears degenerative. Upper chest: Negative. Other: 14 mm low-density left thyroid nodule. Given size and patient age, no dedicated further imaging recommended. IMPRESSION: 1. No acute intracranial abnormality. No skull fracture. Stable atrophy, chronic small vessel ischemia and remote lacunar infarcts. 2. Degenerative change in the cervical spine without acute fracture or subluxation. Electronically Signed   By: Rubye OaksMelanie  Ehinger M.D.   On: 04/15/2018 22:30    Ct Cervical Spine Wo Contrast  Result Date: 04/15/2018 CLINICAL DATA:  Trip and fall landing on left side. Head trauma, headache; C-spine trauma, high clinical risk (NEXUS/CCR) EXAM: CT HEAD WITHOUT CONTRAST CT CERVICAL SPINE WITHOUT CONTRAST TECHNIQUE: Multidetector CT imaging of the head and cervical spine was performed following the standard protocol without intravenous contrast. Multiplanar CT image reconstructions of the cervical spine were also generated. COMPARISON:  Head CT 01/09/2016 FINDINGS: CT HEAD FINDINGS Brain: Stable degree of atrophy and chronic small vessel ischemia. Remote lacunar infarcts in bilateral basal ganglia. No intracranial hemorrhage, mass effect, or  midline shift. No hydrocephalus. The basilar cisterns are patent. No evidence of territorial infarct or acute ischemia. No extra-axial or intracranial fluid collection. Vascular: No hyperdense vessel or unexpected calcification. Skull: Right parietal craniotomy. No skull fracture or focal lesion. Sinuses/Orbits: Paranasal sinuses and mastoid air cells are clear. The visualized orbits are unremarkable. Other: None. CT CERVICAL SPINE FINDINGS Alignment: Normal. Skull base and vertebrae: No acute fracture. Vertebral body heights are maintained. The dens and skull base are intact. Soft tissues and spinal canal: No prevertebral fluid or swelling. No visible canal hematoma. Disc levels: Disc space narrowing and endplate spurring most prominent at C5-C6 and C6-C7. Multilevel facet arthropathy. Bony ankylosis of left C3-C4 facet appears degenerative. Upper chest: Negative. Other: 14 mm low-density left thyroid nodule. Given size and patient age, no dedicated further imaging recommended. IMPRESSION: 1. No acute intracranial abnormality. No skull fracture. Stable atrophy, chronic small vessel ischemia and remote lacunar infarcts. 2. Degenerative change in the cervical spine without acute fracture or subluxation. Electronically Signed   By:  Rubye Oaks M.D.   On: 04/15/2018 22:30    Procedures Procedures (including critical care time)  Medications Ordered in ED Medications - No data to display   Initial Impression / Assessment and Plan / ED Course  I have reviewed the triage vital signs and the nursing notes.  Pertinent labs & imaging results that were available during my care of the patient were reviewed by me and considered in my medical decision making (see chart for details).  72 year old female with history of prior CVA with resultant short-term memory issues presenting after a fall.  Patient family believe she hit her head and she is currently on Plavix due to CVA history.  Neurologic exam reassuring but will obtain CT scan of head and neck to assess for acute injuries, along with x-ray of the left elbow.  We will also obtain routine lab work to assess for causes of patient's decrease in baseline function.   CBC wnl, BMP also wnl with no explanation for change in pt functional status reported by sisters. L Elbow XR without fracture or dislocation, CT head and neck are also negative without acute abnormalities. U/A only with small leukocytes, negative nitrites--however, given change in functional status and unreliable pt history, will culture and treat empirically. Pt may also benefit from discontinuation of ativan from medication list given presentation of decreased functional status and fall. Will defer to pcp. Discussed with pt's family. Pt stable for discharge.   Final Clinical Impressions(s) / ED Diagnoses   Final diagnoses:  Fall, initial encounter    ED Discharge Orders        Ordered    cephALEXin (KEFLEX) 500 MG capsule  2 times daily     04/15/18 2255       Ginger Carne, MD 04/15/18 1610    Bethann Berkshire, MD 04/16/18 1120

## 2018-04-15 NOTE — ED Notes (Signed)
Gave Pt ice pack for elbow.

## 2018-04-17 LAB — URINE CULTURE

## 2018-04-18 ENCOUNTER — Telehealth: Payer: Self-pay | Admitting: Emergency Medicine

## 2018-04-18 NOTE — Telephone Encounter (Signed)
Post ED Visit - Positive Culture Follow-up  Culture report reviewed by antimicrobial stewardship pharmacist:  []  Enzo BiNathan Batchelder, Pharm.D. []  Celedonio MiyamotoJeremy Frens, Pharm.D., BCPS AQ-ID []  Garvin FilaMike Maccia, Pharm.D., BCPS []  Georgina PillionElizabeth Martin, Pharm.D., BCPS []  BurlingtonMinh Pham, [address] BoulevardPharm.D., BCPS, AAHIVP []  Estella HuskMichelle Turner, Pharm.D., BCPS, AAHIVP []  Lysle Pearlachel Rumbarger, PharmD, BCPS []  Sherlynn CarbonAustin Lucas, PharmD []  Pollyann SamplesAndy Johnston, PharmD, BCPS [facility] SeminoleBrooke Baggett PharmD  Positive urine culture Treated with amoxicillin and cephalexin, organism sensitive to the same and no further patient follow-up is required at this time.  Berle MullMiller, [place] Fosdick 04/18/2018, 4:28 PM

## 2018-06-13 ENCOUNTER — Other Ambulatory Visit: Payer: Self-pay | Admitting: Nurse Practitioner

## 2018-06-13 DIAGNOSIS — Z1231 Encounter for screening mammogram for malignant neoplasm of breast: Secondary | ICD-10-CM

## 2018-07-17 ENCOUNTER — Ambulatory Visit
Admission: RE | Admit: 2018-07-17 | Discharge: 2018-07-17 | Disposition: A | Discharge status: Home / Self Care | Payer: Medicare Other | Source: Ambulatory Visit | Attending: Nurse Practitioner | Admitting: Nurse Practitioner

## 2018-07-17 DIAGNOSIS — Z1231 Encounter for screening mammogram for malignant neoplasm of breast: Secondary | ICD-10-CM

## 2018-12-24 ENCOUNTER — Other Ambulatory Visit: Payer: Self-pay

## 2018-12-24 ENCOUNTER — Emergency Department (HOSPITAL_COMMUNITY): Payer: Medicare Other

## 2018-12-24 ENCOUNTER — Encounter (HOSPITAL_COMMUNITY): Payer: Self-pay | Admitting: *Deleted

## 2018-12-24 ENCOUNTER — Emergency Department (HOSPITAL_COMMUNITY)
Admission: EM | Admit: 2018-12-24 | Discharge: 2018-12-24 | Disposition: A | Discharge status: Home / Self Care | Payer: Medicare Other | Attending: Emergency Medicine | Admitting: Emergency Medicine

## 2018-12-24 DIAGNOSIS — Y92003 Bedroom of unspecified non-institutional (private) residence as the place of occurrence of the external cause: Secondary | ICD-10-CM | POA: Diagnosis not present

## 2018-12-24 DIAGNOSIS — L03116 Cellulitis of left lower limb: Secondary | ICD-10-CM | POA: Insufficient documentation

## 2018-12-24 DIAGNOSIS — M25562 Pain in left knee: Secondary | ICD-10-CM

## 2018-12-24 DIAGNOSIS — E119 Type 2 diabetes mellitus without complications: Secondary | ICD-10-CM | POA: Diagnosis not present

## 2018-12-24 DIAGNOSIS — E039 Hypothyroidism, unspecified: Secondary | ICD-10-CM | POA: Insufficient documentation

## 2018-12-24 DIAGNOSIS — Z79899 Other long term (current) drug therapy: Secondary | ICD-10-CM | POA: Diagnosis not present

## 2018-12-24 DIAGNOSIS — W06XXXA Fall from bed, initial encounter: Secondary | ICD-10-CM | POA: Diagnosis not present

## 2018-12-24 DIAGNOSIS — Z7902 Long term (current) use of antithrombotics/antiplatelets: Secondary | ICD-10-CM | POA: Insufficient documentation

## 2018-12-24 DIAGNOSIS — Y9389 Activity, other specified: Secondary | ICD-10-CM | POA: Insufficient documentation

## 2018-12-24 DIAGNOSIS — Z23 Encounter for immunization: Secondary | ICD-10-CM | POA: Diagnosis not present

## 2018-12-24 DIAGNOSIS — Y998 Other external cause status: Secondary | ICD-10-CM | POA: Insufficient documentation

## 2018-12-24 DIAGNOSIS — W19XXXA Unspecified fall, initial encounter: Secondary | ICD-10-CM

## 2018-12-24 DIAGNOSIS — Z7984 Long term (current) use of oral hypoglycemic drugs: Secondary | ICD-10-CM | POA: Insufficient documentation

## 2018-12-24 LAB — CBC WITH DIFFERENTIAL/PLATELET
Abs Immature Granulocytes: 0.09 10*3/uL — ABNORMAL HIGH (ref 0.00–0.07)
Basophils Absolute: 0 10*3/uL (ref 0.0–0.1)
Basophils Relative: 0 %
Eosinophils Absolute: 0.1 10*3/uL (ref 0.0–0.5)
Eosinophils Relative: 0 %
HCT: 40.5 % (ref 36.0–46.0)
Hemoglobin: 13.1 g/dL (ref 12.0–15.0)
Immature Granulocytes: 1 %
Lymphocytes Relative: 18 %
Lymphs Abs: 2.6 10*3/uL (ref 0.7–4.0)
MCH: 30.6 pg (ref 26.0–34.0)
MCHC: 32.3 g/dL (ref 30.0–36.0)
MCV: 94.6 fL (ref 80.0–100.0)
Monocytes Absolute: 1.2 10*3/uL — ABNORMAL HIGH (ref 0.1–1.0)
Monocytes Relative: 9 %
Neutro Abs: 10.6 10*3/uL — ABNORMAL HIGH (ref 1.7–7.7)
Neutrophils Relative %: 72 %
Platelets: 223 10*3/uL (ref 150–400)
RBC: 4.28 MIL/uL (ref 3.87–5.11)
RDW: 13.4 % (ref 11.5–15.5)
WBC: 14.5 10*3/uL — ABNORMAL HIGH (ref 4.0–10.5)
nRBC: 0 % (ref 0.0–0.2)

## 2018-12-24 LAB — BASIC METABOLIC PANEL
Anion gap: 9 (ref 5–15)
BUN: 16 mg/dL (ref 8–23)
CO2: 24 mmol/L (ref 22–32)
Calcium: 9.9 mg/dL (ref 8.9–10.3)
Chloride: 104 mmol/L (ref 98–111)
Creatinine, Ser: 0.92 mg/dL (ref 0.44–1.00)
GFR calc Af Amer: >60 mL/min (ref >60)
GFR calc non Af Amer: >60 mL/min (ref >60)
Glucose, Bld: 145 mg/dL — ABNORMAL HIGH (ref 70–99)
Potassium: 4 mmol/L (ref 3.5–5.1)
Sodium: 137 mmol/L (ref 135–145)

## 2018-12-24 LAB — C-REACTIVE PROTEIN: CRP: 5.8 mg/dL — ABNORMAL HIGH (ref <1.0)

## 2018-12-24 LAB — SEDIMENTATION RATE: Sed Rate: 20 mm/hr (ref 0–22)

## 2018-12-24 MED ORDER — DIPHENHYDRAMINE HCL 50 MG/ML IJ SOLN
25.0000 mg | Freq: Once | INTRAMUSCULAR | Status: AC
  Administered 2018-12-24: 25 mg via INTRAVENOUS

## 2018-12-24 MED ORDER — CLINDAMYCIN HCL 150 MG PO CAPS: 300.0000 mg | ORAL_CAPSULE | Freq: Four times a day (QID) | ORAL | 0 refills | Status: AC

## 2018-12-24 MED ORDER — DIPHENHYDRAMINE HCL 50 MG/ML IJ SOLN
INTRAMUSCULAR | Status: AC
  Filled 2018-12-24: qty 1

## 2018-12-24 MED ORDER — TETANUS-DIPHTH-ACELL PERTUSSIS 5-2.5-18.5 LF-MCG/0.5 IM SUSP
0.5000 mL | Freq: Once | INTRAMUSCULAR | Status: AC
  Administered 2018-12-24: 0.5 mL via INTRAMUSCULAR
  Filled 2018-12-24: qty 0.5

## 2018-12-24 MED ORDER — MORPHINE SULFATE (PF) 4 MG/ML IV SOLN
4.0000 mg | Freq: Once | INTRAVENOUS | Status: DC
  Filled 2018-12-24: qty 1

## 2018-12-24 MED ORDER — VANCOMYCIN HCL IN DEXTROSE 1-5 GM/200ML-% IV SOLN
1000.0000 mg | Freq: Once | INTRAVENOUS | Status: AC
  Administered 2018-12-24: 1000 mg via INTRAVENOUS
  Filled 2018-12-24: qty 200

## 2018-12-24 NOTE — ED Notes (Signed)
Checked with PA [facility] about if pt was still NPO as she has been asking to eat.  Mina advised pt allowed to eat.  Provided [place] sandwich and drink to the pt.

## 2018-12-24 NOTE — Discharge Instructions (Signed)
1. Medications: Please take all of your antibiotics until finished!   You may develop abdominal discomfort or diarrhea from the antibiotic.  You may help offset this with probiotics which you can buy or get in yogurt. Do not eat  or take the probiotics until 2 hours after your antibiotic.  You can take [phone] mg of Tylenol every 6 hours as needed for pain. Do not exceed 4000 mg of Tylenol daily.  2. Treatment: rest, ice, elevate and use brace, drink plenty of fluids, gentle stretching 3. Follow Up: Please call the [facility] orthopedics office first thing tomorrow morning to set up a follow-up appointment in 24 to 48 hours.  This was the recommendation from Dr. Devonne Doughty, the orthopedist I spoke with on the phone today; Please return to the ER for worsening symptoms or other concerns such as worsening swelling, spread of redness or streaking of redness up the leg outside of the marked area, fevers, loss of pulses, or loss of feeling

## 2018-12-24 NOTE — ED Notes (Signed)
Bed: [facility] Expected date:  Expected time:  Means of arrival:  Comments: EMS- 76 F, knee pain

## 2018-12-24 NOTE — ED Triage Notes (Signed)
Per EMS, pt from [facility] complains of left knee pain. Pt fell 1 day ago. Knee is warm to touch and has small abrasion. Pt usually ambulates with walker.   BP 128/74 HR 90 RR 18 CBG 173

## 2018-12-24 NOTE — ED Provider Notes (Signed)
[facility] DEPT Provider Note   CSN: [id number] Arrival date & time: 12/24/18  1138    History   Chief Complaint Chief Complaint  Patient presents with  . Knee Pain    HPI Brittney Moss is a 73 y.o. female with history of CVA with residual memory problems, hyperlipidemia, hypothyroidism type 2 diabetes mellitus presents for evaluation of acute onset, progressively worsening left knee pain secondary to fall yesterday morning.  She reports that she was sitting at the edge of her bed when she slid off, landing on her left knee.  Denies head injury or loss of consciousness.  No prodrome leading up to the fall.  She notes that as the day went on she began to develop progressively worsening left knee pain localized around the patella with no radiation.  Pain worsens with flexion of the knee, improved somewhat with extension.  Reports that she has been able to ambulate with the aid of her walker.  Denies numbness or tingling.  Notes progressively worsening erythema around the knee.  States that she landed on carpet.  Unsure if her tetanus is up-to-date.  Denies fevers, nausea, vomiting, diaphoresis, fatigue.     The history is provided by the patient.    Past Medical History:  Diagnosis Date  . Blood clots in brain   . Depression   . Gait disorder   . 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 Hyperlipidemia   . Hypothyroidism   . Radius fracture    left wrist  . Short-term memory loss    post  cva  in 05/ 2000  . Stroke ([place])   . Type 2 diabetes mellitus [facility])     Patient Active Problem List   Diagnosis Date Noted  . Fracture of left distal radius 04/29/2014  . Gait disorder 07/31/2013  . Cognitive decline 07/31/2013  . Cholelithiasis with cholecystitis 10/17/2011  . RASH-NONVESICULAR 09/05/2007  .  CELLULITIS/ABSCESS, FACE 07/23/2007  . HYPERLIPIDEMIA 07/02/2007  . DEPRESSION 07/02/2007  . HYPOTHYROIDISM 03/21/2007  . CVA 03/21/2007  . CRANIOTOMY, HX OF 03/21/2007  . DILATION AND CURETTAGE, HX OF 03/21/2007    Past Surgical History:  Procedure Laterality Date  . BENIGN BREAST BX    . BREAST EXCISIONAL BIOPSY Right   . CHOLECYSTECTOMY  11/09/2011   Procedure: LAPAROSCOPIC CHOLECYSTECTOMY WITH INTRAOPERATIVE CHOLANGIOGRAM;  Surgeon: Earnstine Regal, MD;  Location: [facility];  Service: General;  Laterality: N/A;  . OPEN REDUCTION INTERNAL FIXATION (ORIF) DISTAL RADIAL FRACTURE Left 04/29/2014   Procedure: OPEN REDUCTION INTERNAL FIXATION (ORIF) DISTAL RADIAL FRACTURE;  Surgeon: Linna Hoff, MD;  Location: [facility];  Service: Orthopedics;  Laterality: Left;  . subdrual hematoma  2000   as a result of the stroke   . SUBDURAL HEMATOMA EVACUATION VIA CRANIOTOMY  05/  2000  . TONSILLECTOMY    . TUBAL LIGATION    . WISDOM TOOTH EXTRACTION       OB History   No obstetric history on file.      Home Medications    Prior to Admission medications   Medication Sig Start Date End Date Taking? Authorizing Provider  acetaminophen (TYLENOL) 500 MG tablet Take 500 mg by mouth every 6 (six) hours as needed for pain.   Yes [provider]  ASPERCREME LIDOCAINE EX Apply 1 application topically  daily as needed (cervical joint disease). Aspercreme Lidocaine 4% Cream - Apply to back of neck   Yes [provider]  buPROPion (WELLBUTRIN SR) 150 MG 12 hr tablet Take 150 mg by mouth 2 (two) times daily.    Yes [provider]  Cholecalciferol 1000 UNITS tablet Take 2,000 Units by mouth daily.    Yes [provider]  clopidogrel (PLAVIX) 75 MG tablet Take 75 mg by mouth daily after breakfast.    Yes [provider]  CRESTOR 20 MG tablet Take 20 mg by mouth at bedtime.  01/05/14  Yes [provider]  enalapril (VASOTEC) 5 MG tablet Take  5 mg by mouth daily.  01/06/14  Yes [provider]  fenofibrate (TRICOR) 48 MG tablet Take 48 mg by mouth daily.  03/17/18  Yes [provider]  latanoprost (XALATAN) 0.005 % ophthalmic solution Place 1 drop into both eyes at bedtime. 12/19/18  Yes [provider]  levothyroxine (SYNTHROID, LEVOTHROID) 112 MCG tablet Take 112 mcg by mouth daily before breakfast.   Yes [provider]  loperamide (IMODIUM) 2 MG capsule Take 4 mg by mouth daily as needed for diarrhea or loose stools.  04/04/18  Yes [provider]  LORazepam (ATIVAN) 0.5 MG tablet Take 1 mg by mouth at bedtime as needed for sleep.    Yes [provider]  magnesium hydroxide (MILK OF MAGNESIA) 400 MG/5ML suspension Take 30 mLs by mouth daily as needed for mild constipation.   Yes [provider]  metFORMIN (GLUCOPHAGE) 1000 MG tablet Take 1,000 mg by mouth 2 (two) times daily with a meal.  03/28/18  Yes [provider]  metoprolol succinate (TOPROL-XL) 25 MG 24 hr tablet Take 25 mg by mouth daily.   Yes [provider]  Multiple Vitamins-Minerals (CERTA-VITE SENIOR-LUTEIN PO) Take 1 tablet by mouth daily.   Yes [provider]  OVER THE COUNTER MEDICATION Take 1 tablet by mouth daily. Viviscal tablet    Yes [provider]  Skin Protectants, Misc. (BAZA PROTECT EX) Apply 1 application topically 2 (two) times daily. Baza Protect 12%-1% - Clean and dry affected area and apply twice daily for 28 days   Yes [provider]  clindamycin (CLEOCIN) 150 MG capsule Take 2 capsules (300 mg total) by mouth 4 (four) times daily for 7 days. 12/24/18 12/31/18  Renita Papa, PA-C    Family History No family history on file.  Social History Social History   Tobacco Use  . Smoking status: Never Smoker  . Smokeless tobacco: Never Used  Substance Use Topics  . Alcohol use: No  . Drug use: No     Allergies   Patient has no known  allergies.   Review of Systems Review of Systems  Constitutional: Negative for chills and fever.  Musculoskeletal: Positive for arthralgias, gait problem and joint swelling.  Skin: Positive for color change.  Neurological: Negative for syncope, weakness, numbness and headaches.  All other systems reviewed and are negative.    Physical Exam Updated Vital Signs BP 120/79 (BP Location: Left Arm)   Pulse 86   Temp 99.3 F (37.4 C) (Oral)   Resp 18   SpO2 100%   Physical Exam Vitals signs and nursing note reviewed.  Constitutional:      General: She is not in acute distress.    Appearance: She is well-developed.  HENT:     Head: Normocephalic and atraumatic.  Eyes:     General:  Right eye: No discharge.        Left eye: No discharge.     Conjunctiva/sclera: Conjunctivae normal.  Neck:     Vascular: No JVD.     Trachea: No tracheal deviation.  Cardiovascular:     Rate and Rhythm: Normal rate and regular rhythm.     Pulses: Normal pulses.     Heart sounds: Normal heart sounds.     Comments: 2+  DP/PT pulses bilaterally, Homans sign absent bilaterally, no lower extremity edema, no palpable cords, compartments are soft  Pulmonary:     Effort: Pulmonary effort is normal.     Breath sounds: Normal breath sounds.  Abdominal:     General: There is no distension.  Musculoskeletal:        General: Swelling and tenderness present.     Comments: See below image.  Left knee with overlying erythema, warmth, and induration with some superficial wounds.  No drainage.  Decreased active and passive range of motion with flexion secondary to pain.  Negative anterior/posterior drawer test.  No varus or valgus instability however she does have pain with varus testing.  5/5 strength of BLE major muscle groups.  Skin:    General: Skin is warm and dry.     Findings: No erythema.  Neurological:     Mental Status: She is alert.     Comments: Fluent speech, no facial droop, sensation  intact to soft touch of bilateral lower extremities  Psychiatric:        Behavior: Behavior normal.        ED Treatments / Results  Labs (all labs ordered are listed, but only abnormal results are displayed) Labs Reviewed  BASIC METABOLIC PANEL - Abnormal; Notable for the following components:      Result Value   Glucose, Bld 145 (*)    All other components within normal limits  CBC WITH DIFFERENTIAL/PLATELET - Abnormal; Notable for the following components:   WBC 14.5 (*)    Neutro Abs 10.6 (*)    Monocytes Absolute 1.2 (*)    Abs Immature Granulocytes 0.09 (*)    All other components within normal limits  C-REACTIVE PROTEIN - Abnormal; Notable for the following components:   CRP 5.8 (*)    All other components within normal limits  SEDIMENTATION RATE    EKG None  Radiology Ct Knee Left Wo Contrast  Result Date: 12/24/2018 CLINICAL DATA:  [place] 1 day ago.  Knee pain. EXAM: CT OF THE left KNEE WITHOUT CONTRAST TECHNIQUE: Multidetector CT imaging of the left knee was performed according to the standard protocol. Multiplanar CT image reconstructions were also generated. COMPARISON:  12/24/2010 FINDINGS: Moderate medial compartment degenerative changes with joint space narrowing, early spurring, mild bony eburnation and subchondral cystic change. There are also mild degenerative changes at the patellofemoral joint with subchondral cystic change accounting for the abnormality on the x-ray. No patellar fracture is identified. No fractures of the tibia, fibula or femur. No joint effusion. Anterior soft tissue swelling consistent with contusion or hematoma. Grossly the cruciate and collateral ligaments are intact and the quadriceps and patellar tendons are intact. IMPRESSION: 1. No acute fracture. 2. Mild tricompartmental degenerative changes, most significant in the medial compartment. 3. Diffuse anterior soft tissue swelling/edema/hemorrhage consistent with direct trauma. Electronically  Signed   By: Marijo Sanes M.D.   On: 12/24/2018 16:24   Dg Knee Complete 4 Views Left  Result Date: 12/24/2018 CLINICAL DATA:  LEFT knee pain, fell 1 day ago, knee  is warm to touch, peaking color, anterior swelling, small abrasion EXAM: LEFT KNEE - COMPLETE 4+ VIEW COMPARISON:  None FINDINGS: Mild osseous demineralization. Joint space narrowing. Linear lucency identified in the posterior surface of the mid to upper patella on the lateral view, could represent a fissure from degenerative changes or a nondisplaced fracture. No joint effusion. Anterior prepatellar and infrapatellar soft tissue swelling. IMPRESSION: Anterior soft tissue swelling with a linear lucency at the posterior margin of the mid to upper patella on the lateral view, question fissure versus nondisplaced fracture; recommend CT assessment. Electronically Signed   By: Lavonia Dana M.D.   On: 12/24/2018 13:14    Procedures Procedures (including critical care time)  Medications Ordered in ED Medications  morphine 4 MG/ML injection 4 mg (4 mg Intravenous Refused 12/24/18 1724)  diphenhydrAMINE (BENADRYL) 50 MG/ML injection (has no administration in time range)  Tdap (BOOSTRIX) injection 0.5 mL (0.5 mLs Intramuscular Given 12/24/18 1543)  vancomycin (VANCOCIN) IVPB 1000 mg/200 mL premix (0 mg Intravenous Stopped 12/24/18 1721)  diphenhydrAMINE (BENADRYL) injection 25 mg (25 mg Intravenous Given 12/24/18 1630)     Initial Impression / Assessment and Plan / ED Course  I have reviewed the triage vital signs and the nursing notes.  Pertinent labs & imaging results that were available during my care of the patient were reviewed by me and considered in my medical decision making (see chart for details).  Patient presenting for evaluation of left knee pain and overlying cellulitis secondary to mechanical fall yesterday morning.  She is afebrile, vital signs are stable.  She is nontoxic in appearance.  Neurovascularly intact.  No focal  neurologic deficits on examination.  No prodrome leading up to the fall.  Examination is concerning for significant erythema and induration noted overlying the left knee.  Radiographs show anterior soft tissue swelling and a linear lucency at the posterior margin of the mid to upper patella on the lateral view suggesting possible fissure versus nondisplaced fracture.  3:04 PM Spoke with Dr. Alma Friendly with general surgery who recommends a dose of IV vancomycin, obtaining CT of the knee to rule out patella fracture, placing the patient in a knee immobilizer, and he will follow-up with her in the office tomorrow.   CT shows no acute fracture but does show mild tricompartmental degenerative changes and diffuse anterior soft tissue swelling/edema/hemorrhage consistent with her history of trauma.  Lab work reviewed by me significant for leukocytosis and elevated CRP.  ESR within normal limits.  Her blood glucose is elevated at 580 but no metabolic derangements or renal insufficiency otherwise.  Low suspicion of septic arthritis given no joint effusion on radiographs and preserved range of motion despite pain.  She was given IV vancomycin in the ED and the area of erythema was marked with a pen.  She was placed in a knee immobilizer and ambulated with a walker which is her baseline.  She understands to follow-up with orthopedics for reevaluation tomorrow.  Discussed conservative therapy with Tylenol, ice, gentle stretching.  Discussed strict ED return precautions.  Patient and sisters verbalized understanding of and agreement with plan and patient stable for discharge home at this time.  Final Clinical Impressions(s) / ED Diagnoses   Final diagnoses:  Cellulitis of knee, left  Fall, initial encounter  Acute pain of left knee    ED Discharge Orders         Ordered    clindamycin (CLEOCIN) 150 MG capsule  4 times daily     12/24/18  [place], Nikka Hakimian A, PA-C 12/24/18 2303    Duffy Bruce,  MD 12/25/18 1353

## 2018-12-24 NOTE — Progress Notes (Signed)
A consult was received from an ED physician for vancomycin per pharmacy dosing (for an indication other than meningitis). The patient's profile has been reviewed for ht/wt/allergies/indication/available labs. A one time order has been placed for the above antibiotics.  Further antibiotics/pharmacy consults should be ordered by admitting physician if indicated.                       Bernadene Person, PharmD, BCPS [phone] 12/24/2018, 2:29 PM

## 2019-09-30 ENCOUNTER — Encounter (HOSPITAL_COMMUNITY): Payer: Self-pay | Admitting: Emergency Medicine

## 2019-09-30 ENCOUNTER — Emergency Department (HOSPITAL_COMMUNITY)
Admission: EM | Admit: 2019-09-30 | Discharge: 2019-09-30 | Disposition: A | Discharge status: Home / Self Care | Payer: Medicare Other | Attending: Emergency Medicine | Admitting: Emergency Medicine

## 2019-09-30 ENCOUNTER — Emergency Department (HOSPITAL_COMMUNITY): Payer: Medicare Other

## 2019-09-30 DIAGNOSIS — H538 Other visual disturbances: Secondary | ICD-10-CM | POA: Diagnosis not present

## 2019-09-30 DIAGNOSIS — M549 Dorsalgia, unspecified: Secondary | ICD-10-CM | POA: Insufficient documentation

## 2019-09-30 DIAGNOSIS — R079 Chest pain, unspecified: Secondary | ICD-10-CM | POA: Diagnosis not present

## 2019-09-30 DIAGNOSIS — Y9389 Activity, other specified: Secondary | ICD-10-CM | POA: Insufficient documentation

## 2019-09-30 DIAGNOSIS — W1812XA Fall from or off toilet with subsequent striking against object, initial encounter: Secondary | ICD-10-CM | POA: Insufficient documentation

## 2019-09-30 DIAGNOSIS — R109 Unspecified abdominal pain: Secondary | ICD-10-CM | POA: Diagnosis not present

## 2019-09-30 DIAGNOSIS — Z7984 Long term (current) use of oral hypoglycemic drugs: Secondary | ICD-10-CM | POA: Diagnosis not present

## 2019-09-30 DIAGNOSIS — Y92012 Bathroom of single-family (private) house as the place of occurrence of the external cause: Secondary | ICD-10-CM | POA: Diagnosis not present

## 2019-09-30 DIAGNOSIS — S0993XA Unspecified injury of face, initial encounter: Secondary | ICD-10-CM | POA: Insufficient documentation

## 2019-09-30 DIAGNOSIS — Y998 Other external cause status: Secondary | ICD-10-CM | POA: Diagnosis not present

## 2019-09-30 DIAGNOSIS — Z8673 Personal history of transient ischemic attack (TIA), and cerebral infarction without residual deficits: Secondary | ICD-10-CM | POA: Diagnosis not present

## 2019-09-30 DIAGNOSIS — R112 Nausea with vomiting, unspecified: Secondary | ICD-10-CM | POA: Insufficient documentation

## 2019-09-30 DIAGNOSIS — E039 Hypothyroidism, unspecified: Secondary | ICD-10-CM | POA: Diagnosis not present

## 2019-09-30 DIAGNOSIS — Z79899 Other long term (current) drug therapy: Secondary | ICD-10-CM | POA: Diagnosis not present

## 2019-09-30 DIAGNOSIS — W19XXXA Unspecified fall, initial encounter: Secondary | ICD-10-CM

## 2019-09-30 MED ORDER — LIDOCAINE VISCOUS HCL 2 % MT SOLN
15.0000 mL | Freq: Once | OROMUCOSAL | Status: AC
  Administered 2019-09-30: 15 mL via OROMUCOSAL
  Filled 2019-09-30: qty 15

## 2019-09-30 NOTE — Discharge Instructions (Signed)
Take tylenol as needed for pain.  Use ice for pain.  Follow up with your primary care doctor as needed.  Return to the ER with any new, worsening, or concerning symptoms.

## 2019-09-30 NOTE — ED Triage Notes (Signed)
Pt here from assisted living where she fell off the toilet , pt is on Plavix cbg 167  and hit her head on the wall , no loc

## 2019-09-30 NOTE — ED Notes (Signed)
Pt able to walk to [facility]

## 2019-09-30 NOTE — ED Provider Notes (Signed)
[facility] EMERGENCY DEPARTMENT Provider Note   CSN: [id number] Arrival date & time: 09/30/19  6295     History   Chief Complaint Chief Complaint  Patient presents with  . Fall    HPI Brittney Moss is a 73 y.o. female presenting for evaluation after a fall.  Patient states she was on the toilet when she leaned over to wipe her bottom and she fell off the toilet.  She hit the top of her head.  She denies loss of consciousness.  She also bit her tongue, but denies bleeding.  She reports pain only at her tongue, no pain in her head.  She has vision changes, neck pain, back pain, chest pain, nausea, vomiting, abdominal pain.  She has not ambulated for since he is night, but she has walked a little bit without difficulty.  She has not taken anything for pain.  She does take Plavix.     HPI  Past Medical History:  Diagnosis Date  . Blood clots in brain   . Depression   . Gait disorder   . 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 Hyperlipidemia   . Hypothyroidism   . Radius fracture    left wrist  . Short-term memory loss    post  cva  in 05/ 2000  . Stroke ([place])   . Type 2 diabetes mellitus [facility])     Patient Active Problem List   Diagnosis Date Noted  . Fracture of left distal radius 04/29/2014  . Gait disorder 07/31/2013  . Cognitive decline 07/31/2013  . Cholelithiasis with cholecystitis 10/17/2011  . RASH-NONVESICULAR 09/05/2007  . CELLULITIS/ABSCESS, FACE 07/23/2007  . HYPERLIPIDEMIA 07/02/2007  . DEPRESSION 07/02/2007  . HYPOTHYROIDISM 03/21/2007  . CVA 03/21/2007  . CRANIOTOMY, HX OF 03/21/2007  . DILATION AND CURETTAGE, HX OF 03/21/2007    Past Surgical History:  Procedure Laterality Date  . BENIGN BREAST BX    . BREAST EXCISIONAL BIOPSY Right   . CHOLECYSTECTOMY  11/09/2011   Procedure: LAPAROSCOPIC  CHOLECYSTECTOMY WITH INTRAOPERATIVE CHOLANGIOGRAM;  Surgeon: Earnstine Regal, MD;  Location: [facility];  Service: General;  Laterality: N/A;  . OPEN REDUCTION INTERNAL FIXATION (ORIF) DISTAL RADIAL FRACTURE Left 04/29/2014   Procedure: OPEN REDUCTION INTERNAL FIXATION (ORIF) DISTAL RADIAL FRACTURE;  Surgeon: Linna Hoff, MD;  Location: [facility];  Service: Orthopedics;  Laterality: Left;  . subdrual hematoma  2000   as a result of the stroke   . SUBDURAL HEMATOMA EVACUATION VIA CRANIOTOMY  05/  2000  . TONSILLECTOMY    . TUBAL LIGATION    . WISDOM TOOTH EXTRACTION       OB History   No obstetric history on file.      Home Medications    Prior to Admission medications   Medication Sig Start Date End Date Taking? Authorizing Provider  acetaminophen (TYLENOL) 500 MG tablet Take 500 mg by mouth every 6 (six) hours as needed for pain.    [provider]  ASPERCREME LIDOCAINE EX Apply 1 application topically daily as needed (cervical joint disease). Aspercreme Lidocaine 4% Cream - Apply to back of neck    [provider]  buPROPion (WELLBUTRIN SR) 150 MG 12 hr tablet Take 150 mg by mouth 2 (two) times daily.     [provider]  Cholecalciferol 1000  UNITS tablet Take 2,000 Units by mouth daily.     [provider]  clopidogrel (PLAVIX) 75 MG tablet Take 75 mg by mouth daily after breakfast.     [provider]  CRESTOR 20 MG tablet Take 20 mg by mouth at bedtime.  01/05/14   [provider]  enalapril (VASOTEC) 5 MG tablet Take 5 mg by mouth daily.  01/06/14   [provider]  fenofibrate (TRICOR) 48 MG tablet Take 48 mg by mouth daily.  03/17/18   [provider]  latanoprost (XALATAN) 0.005 % ophthalmic solution Place 1 drop into both eyes at bedtime. 12/19/18   [provider]  levothyroxine (SYNTHROID, LEVOTHROID) 112 MCG tablet Take 112 mcg by mouth daily before breakfast.    [provider]   loperamide (IMODIUM) 2 MG capsule Take 4 mg by mouth daily as needed for diarrhea or loose stools.  04/04/18   [provider]  LORazepam (ATIVAN) 0.5 MG tablet Take 1 mg by mouth at bedtime as needed for sleep.     [provider]  magnesium hydroxide (MILK OF MAGNESIA) 400 MG/5ML suspension Take 30 mLs by mouth daily as needed for mild constipation.    [provider]  metFORMIN (GLUCOPHAGE) 1000 MG tablet Take 1,000 mg by mouth 2 (two) times daily with a meal.  03/28/18   [provider]  metoprolol succinate (TOPROL-XL) 25 MG 24 hr tablet Take 25 mg by mouth daily.    [provider]  Multiple Vitamins-Minerals (CERTA-VITE SENIOR-LUTEIN PO) Take 1 tablet by mouth daily.    [provider]  OVER THE COUNTER MEDICATION Take 1 tablet by mouth daily. Viviscal tablet     [provider]  Skin Protectants, Misc. (BAZA PROTECT EX) Apply 1 application topically 2 (two) times daily. Baza Protect 12%-1% - Clean and dry affected area and apply twice daily for 28 days    [provider]    Family History No family history on file.  Social History Social History   Tobacco Use  . Smoking status: Never Smoker  . Smokeless tobacco: Never Used  Substance Use Topics  . Alcohol use: No  . Drug use: No     Allergies   Patient has no known allergies.   Review of Systems Review of Systems  HENT:       Tongue pain, head injury  Hematological: Bruises/bleeds easily.  All other systems reviewed and are negative.    Physical Exam Updated Vital Signs BP 126/69 (BP Location: Right Arm)   Pulse 71   Temp 97.9 F (36.6 C) (Oral)   Resp 18   SpO2 100%   Physical Exam Vitals signs and nursing note reviewed.  Constitutional:      General: She is not in acute distress.    Appearance: She is well-developed.     Comments: Elderly female resting comfortably in the bed no acute distress  HENT:     Head: Normocephalic.      Comments: Hematoma to the crown of the head.  No active bleeding.  No injury noted elsewhere.  No hemotympanum or nasal septal hematoma. Bruising of the ventral surface of the tongue without laceration.  No significant tongue swelling.  Talking and handling secretions easily. Eyes:     Extraocular Movements: Extraocular movements intact.     Conjunctiva/sclera: Conjunctivae normal.     Pupils: Pupils are equal, round, and reactive to light.     Comments: EOMI and PERRLA.  No  nystagmus.  Neck:     Musculoskeletal: Normal range of motion and neck supple.     Comments: No chest palpation midline C-spine pain no step-offs or deformities.  Moving head in all directions without pain Cardiovascular:     Rate and Rhythm: Normal rate and regular rhythm.     Pulses: Normal pulses.  Pulmonary:     Effort: Pulmonary effort is normal. No respiratory distress.     Breath sounds: Normal breath sounds. No wheezing.     Comments: No tenderness to palpation of the chest wall Chest:     Chest wall: No tenderness.  Abdominal:     General: There is no distension.     Palpations: Abdomen is soft. There is no mass.     Tenderness: There is no abdominal tenderness. There is no guarding or rebound.     Comments: No tenderness palpation the abdomen  Musculoskeletal: Normal range of motion.     Comments: No obvious deformity.  No test palpation of back or hips.  Moving all extremities without pain.  Skin:    General: Skin is warm and dry.     Capillary Refill: Capillary refill takes less than 2 seconds.  Neurological:     Mental Status: She is alert and oriented to person, place, and time.      ED Treatments / Results  Labs (all labs ordered are listed, but only abnormal results are displayed) Labs Reviewed - No data to display  EKG None  Radiology Ct Head Wo Contrast  Result Date: 09/30/2019 CLINICAL DATA:  Head trauma, minor, GCS>=13, high clinical risk, initial exam. Fall. On Plavix. A EXAM:  CT HEAD WITHOUT CONTRAST TECHNIQUE: Contiguous axial images were obtained from the base of the skull through the vertex without intravenous contrast. COMPARISON:  Report from head CT 04/16/2018 (images currently unavailable), head CT 01/09/2016 FINDINGS: Brain: No evidence of acute intracranial hemorrhage. No demarcated cortical infarction. No evidence of intracranial mass. No midline shift or extra-axial fluid collection. Redemonstrated chronic lacunar infarcts within the bilateral thalami. Background of mild chronic small vessel ischemic disease. Mild generalized parenchymal atrophy. Vascular: No hyperdense vessel.  Atherosclerotic calcifications Skull: No calvarial fracture.  Right parietal craniotomy. Sinuses/Orbits: Chronic deformity of the right lamina papyracea. No significant paranasal sinus disease or mastoid effusion at the imaged levels. IMPRESSION: 1. No evidence of acute intracranial abnormality. 2. Redemonstrated chronic bilateral thalamic lacunar infarcts with background of mild chronic small vessel ischemic disease. Electronically Signed   By: Jackey Loge DO   On: 09/30/2019 09:14    Procedures Procedures (including critical care time)  Medications Ordered in ED Medications  lidocaine (XYLOCAINE) 2 % viscous mouth solution 15 mL (15 mLs Mouth/Throat Given 09/30/19 0934)     Initial Impression / Assessment and Plan / ED Course  I have reviewed the triage vital signs and the nursing notes.  Pertinent labs & imaging results that were available during my care of the patient were reviewed by me and considered in my medical decision making (see chart for details).        Patient resenting for evaluation after fall.  Patient was leaned over the time, lost her balance.  No chest pain or shortness of breath or dizziness prior to the fall.  Likely mechanical.  On exam, patient has a hematoma to the crown of her head and some confusion to her tongue.  No suturable laceration.  Will obtain  CT head as she is on Plavix.  No injury noted  elsewhere.  I do not think she needs labs or urine, as this was likely mechanical.  This is lidocaine given for tongue discomfort.  CT head negative for acute bleed.  Patient ambulated without difficulty.  Case discussed with attending, Dr. Clarene DukeLittle evaluated the patient.  At this time, patient appears safe for discharge.  Return precautions given.  Patient states she understands and agrees to plan.  Final Clinical Impressions(s) / ED Diagnoses   Final diagnoses:  Fall, initial encounter  Injury of tongue, initial encounter    ED Discharge Orders    None       Alveria ApleyCaccavale, Adriahna Shearman, PA-C 09/30/19 1028    Little, Ambrose Finlandachel Morgan, MD 10/02/19 2027

## 2020-02-23 IMAGING — CR DG KNEE COMPLETE 4+V*L*
4 series · 4 of 4 positions shown · non-contrast
Comparison: None

CLINICAL DATA: LEFT knee pain, fell 1 day ago, knee is warm to
touch, peaking color, anterior swelling, small abrasion

EXAM:
LEFT KNEE - COMPLETE 4+ VIEW

[x knee ap left (1 of 4)]
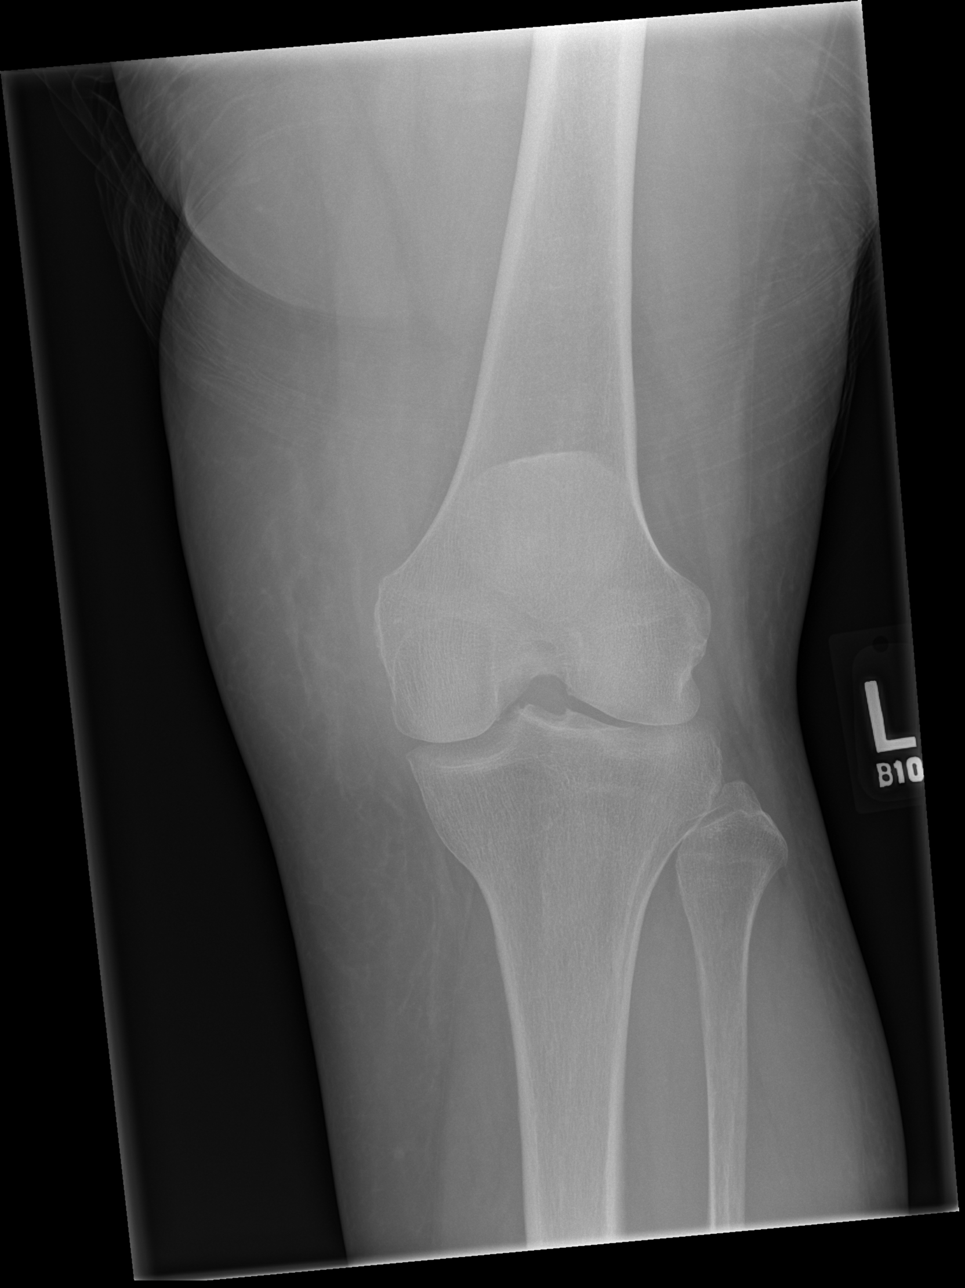

[x knee ap left (2 of 4)]
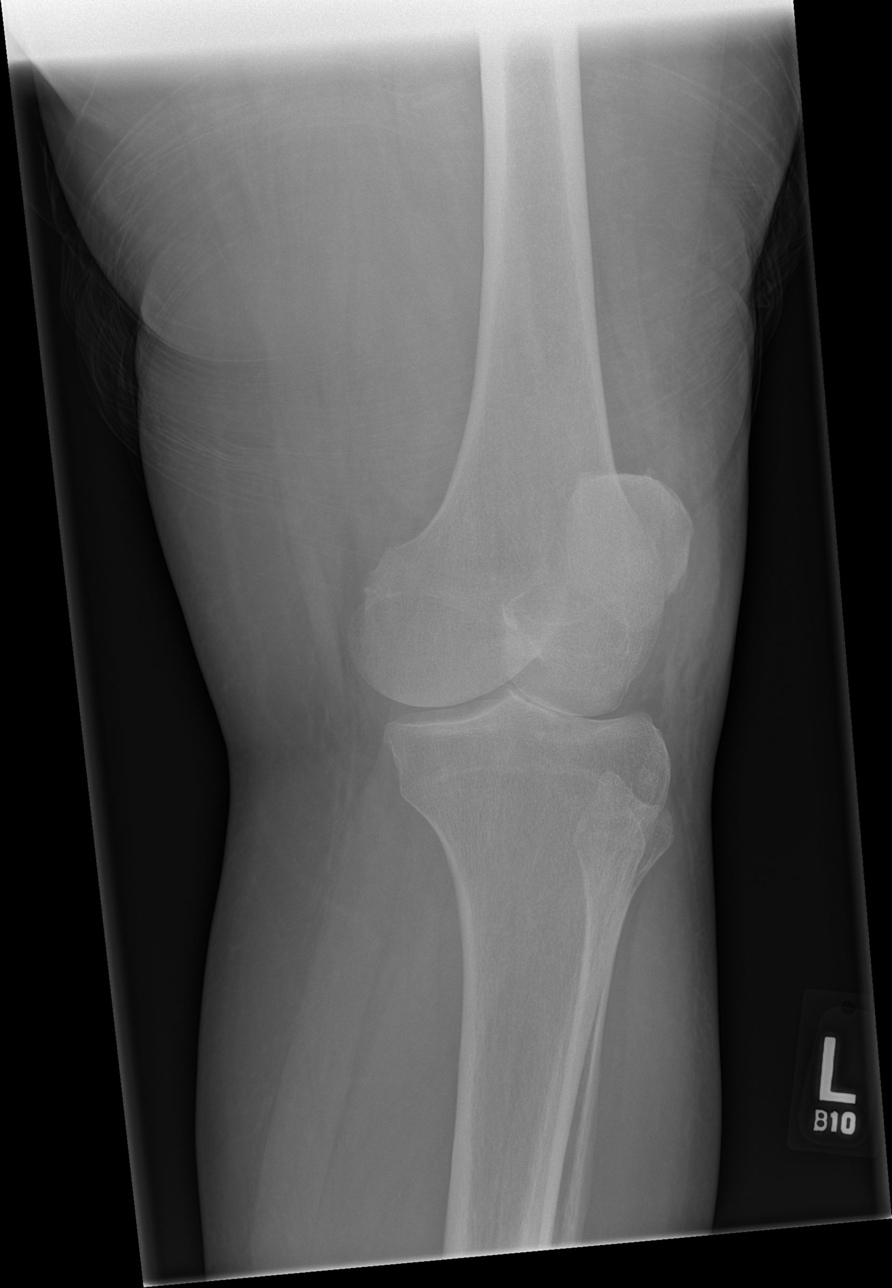

[x knee ap left (3 of 4)]
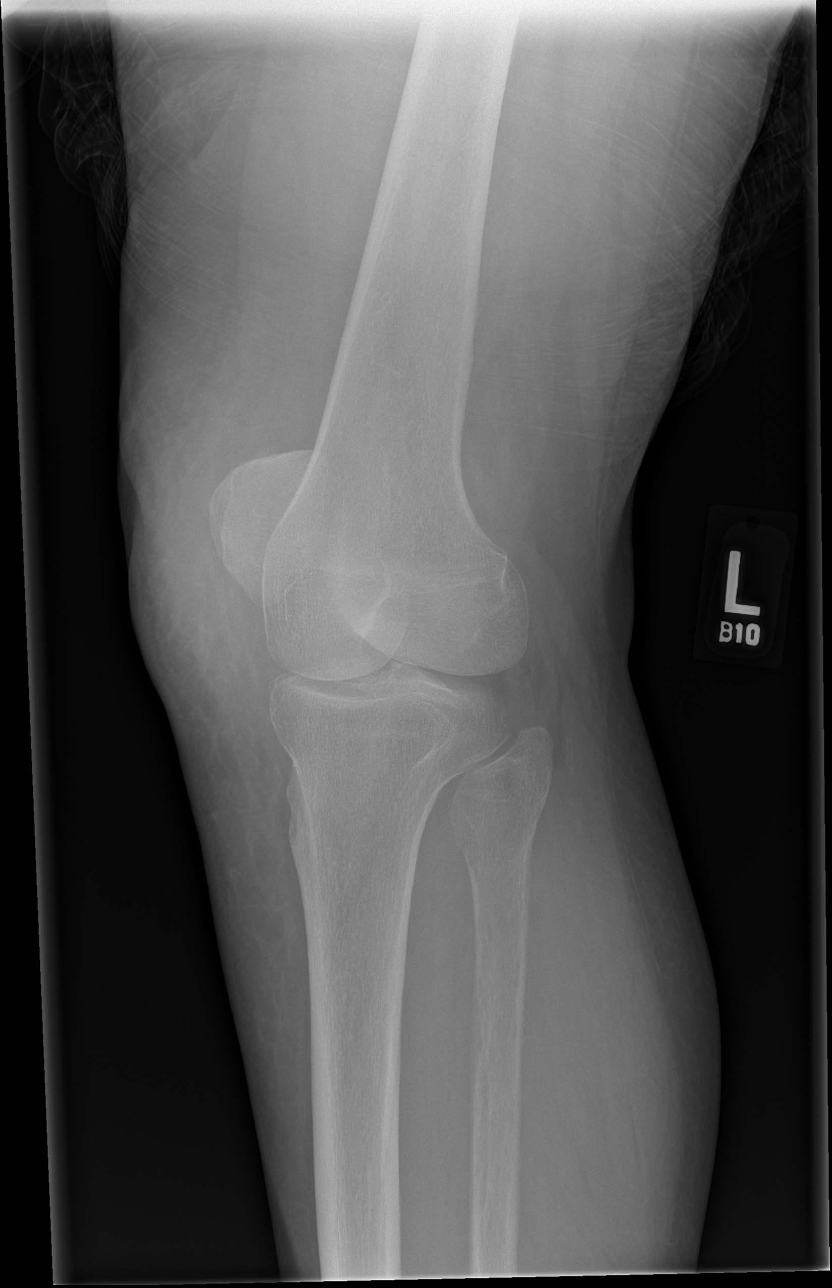

[x knee ap left (4 of 4)]
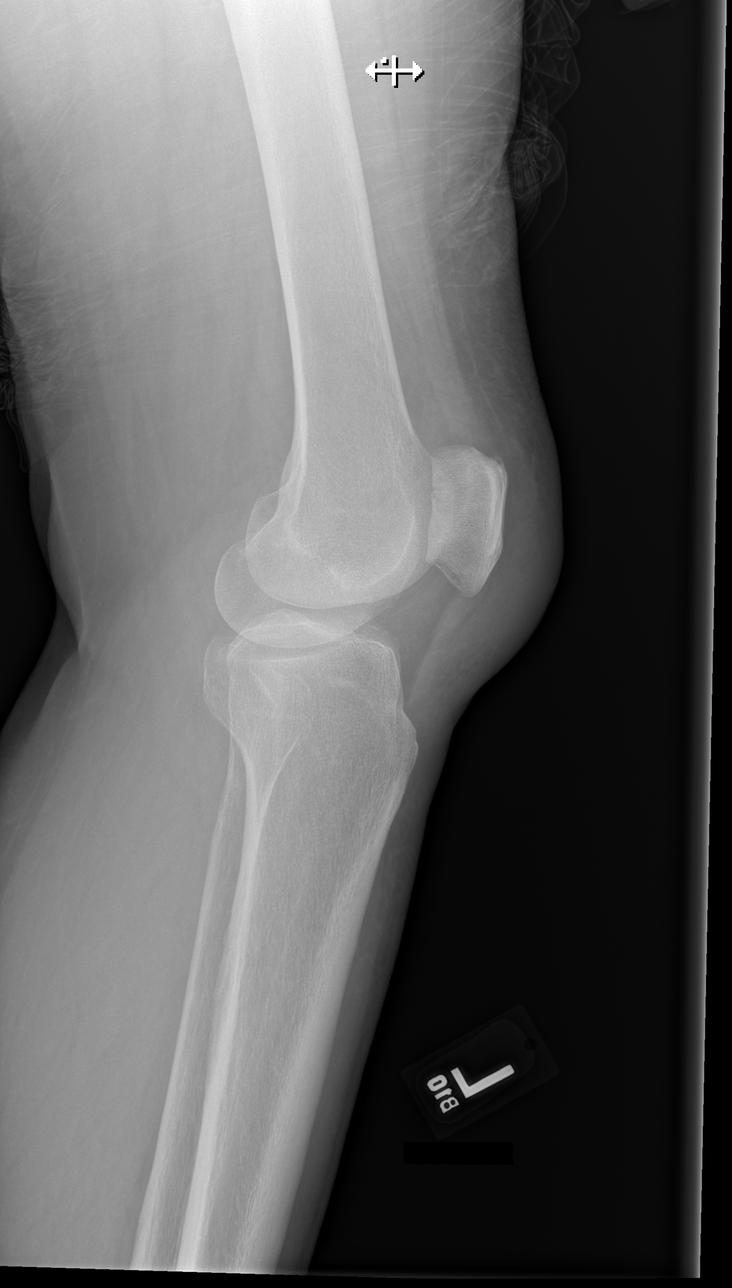

[4 of 4 positions shown; findings below may reference images not displayed]

FINDINGS: Mild osseous demineralization.

Joint space narrowing.

Linear lucency identified in the posterior surface of the mid to
upper patella on the lateral view, could represent a fissure from
degenerative changes or a nondisplaced fracture.

No joint effusion.

Anterior prepatellar and infrapatellar soft tissue swelling.
IMPRESSION: Anterior soft tissue swelling with a linear lucency at the posterior
margin of the mid to upper patella on the lateral view, question
fissure versus nondisplaced fracture; recommend CT assessment.

## 2020-02-23 IMAGING — CT CT KNEE*L* W/O CM
3 of 5 series · 16 of 33 positions shown, 19 images · non-contrast
Comparison: 12/24/2010

CLINICAL DATA: Fell 1 day ago.  Knee pain.

EXAM:
CT OF THE left KNEE WITHOUT CONTRAST
TECHNIQUE: Multidetector CT imaging of the left knee was performed according to
the standard protocol. Multiplanar CT image reconstructions were
also generated.

[Series 4: axial st · axial · 0.42mm/px · z∈[+567,+704]mm · 9 of 109 slices shown, 12 images]
[im 9/109  soft-tissue]
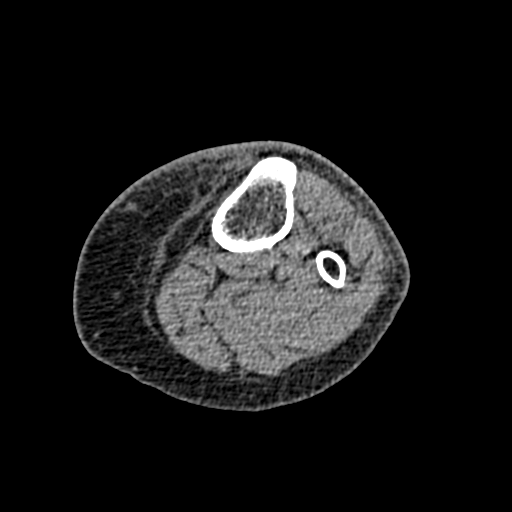
[im 9/109  bone]
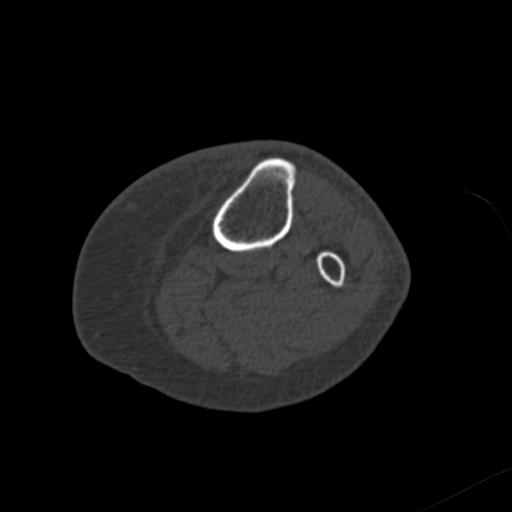
[im 25/109  bone]
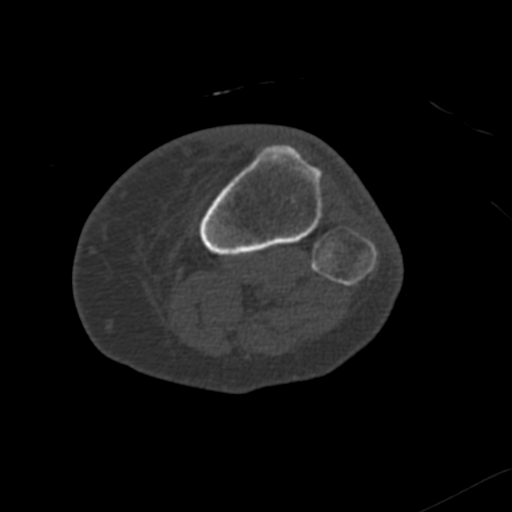
[im 34/109  bone]
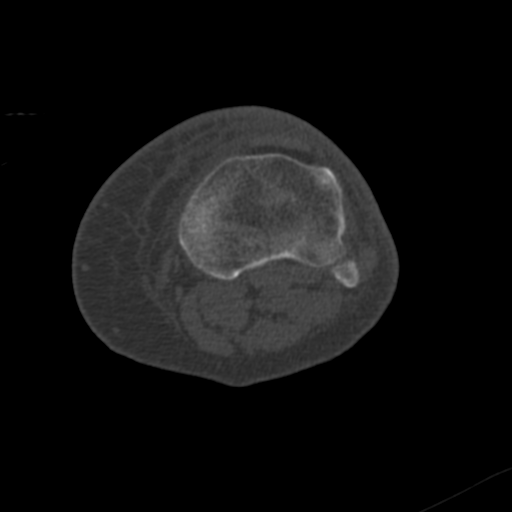
[im 42/109  bone]
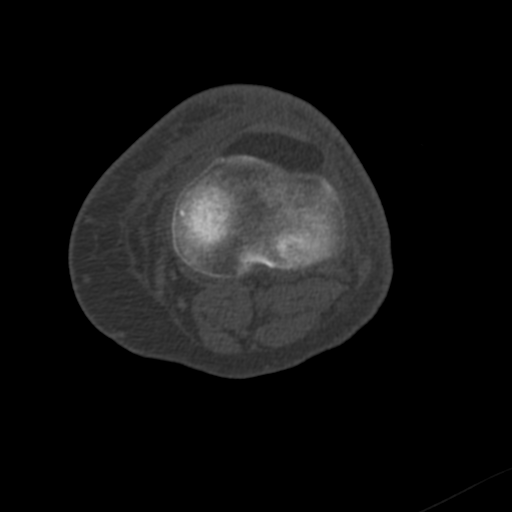
[im 59/109  soft-tissue]
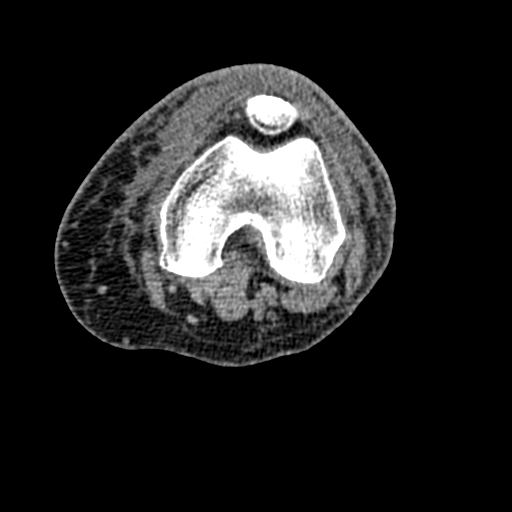
[im 59/109  bone]
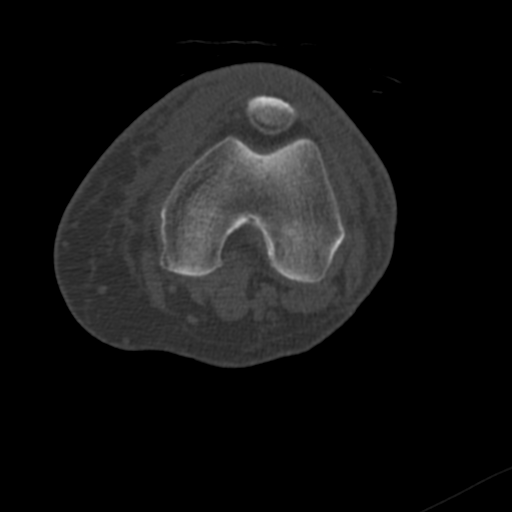
[im 67/109  bone]
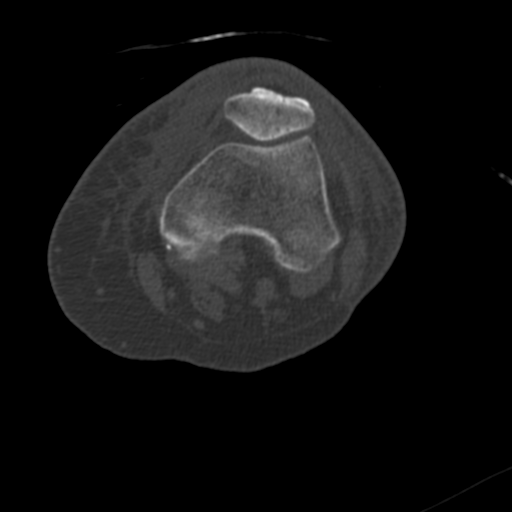
[im 75/109  bone]
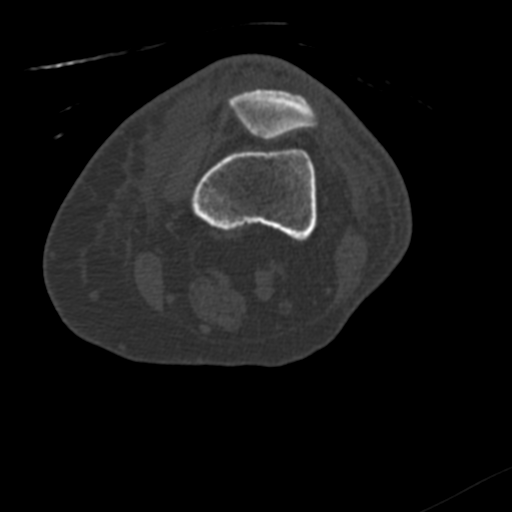
[im 92/109  bone]
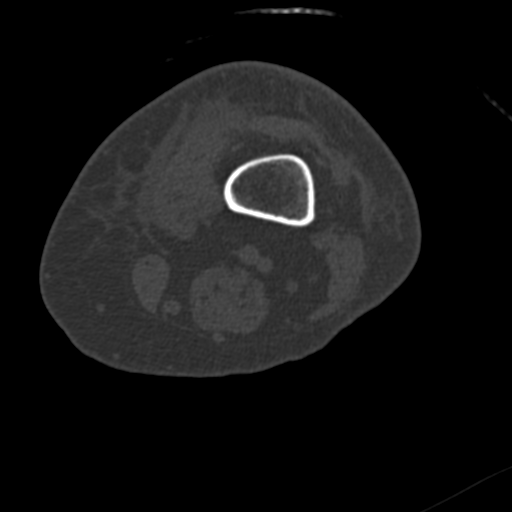
[im 100/109  soft-tissue]
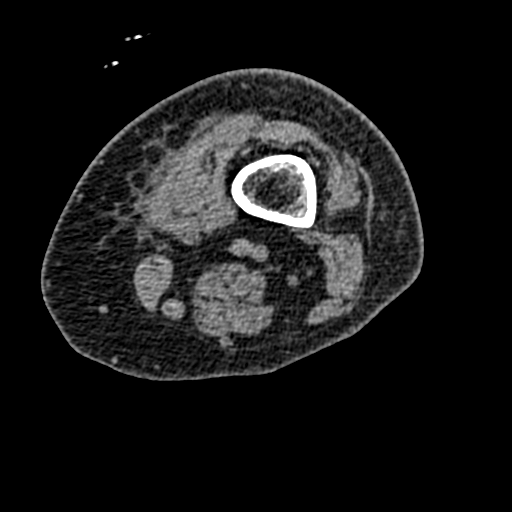
[im 100/109  bone]
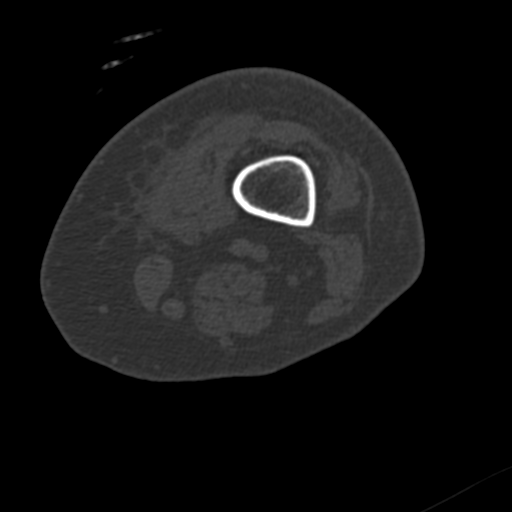

[Series 9: coronal st · coronal · 0.35mm/px · 2 of 106 slices shown]
[im 36/106  bone]
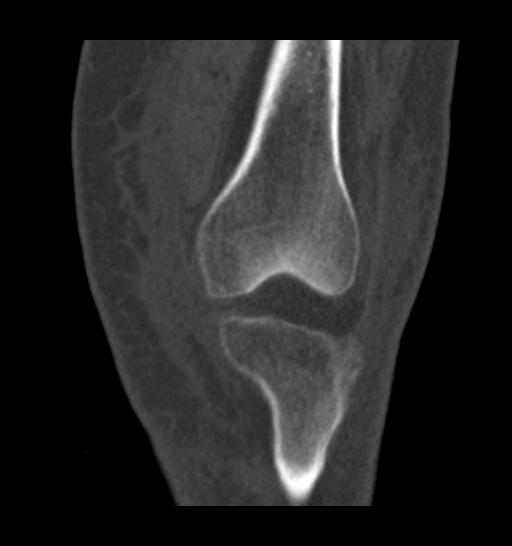
[im 71/106  bone]
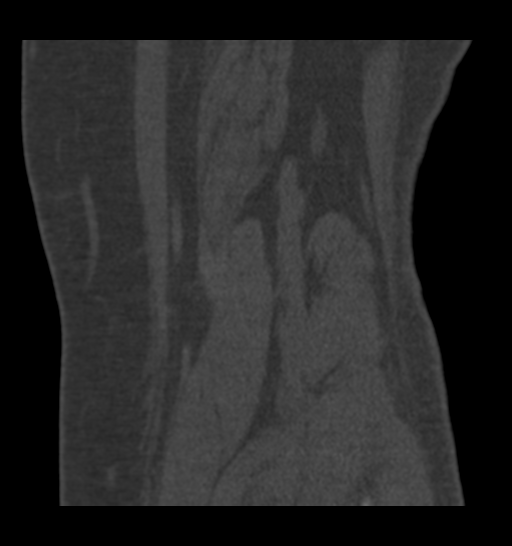

[Series 10: sagittal st · sagittal · 0.40mm/px · 5 of 106 slices shown]
[im 18/106  bone]
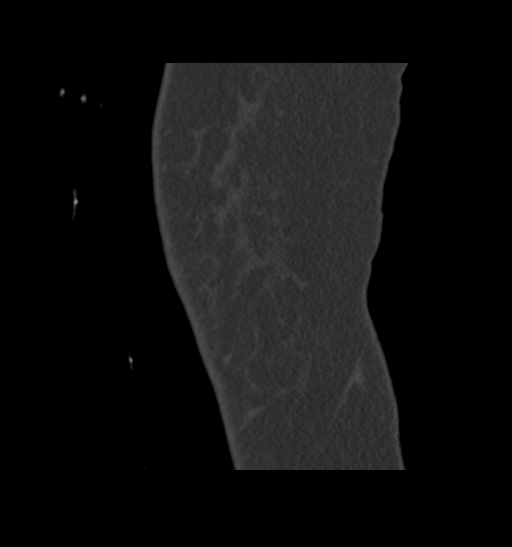
[im 36/106  bone]
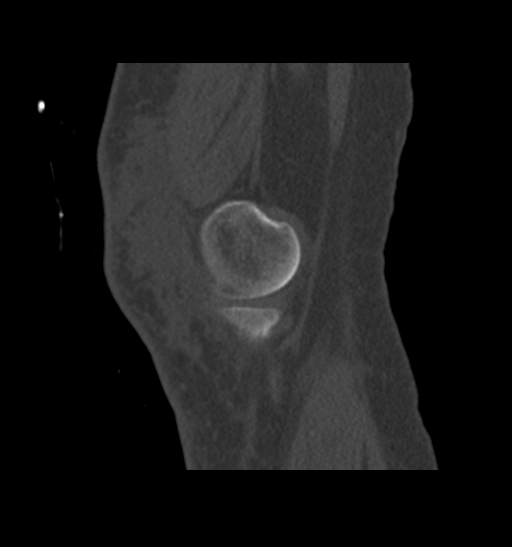
[im 53/106  bone]
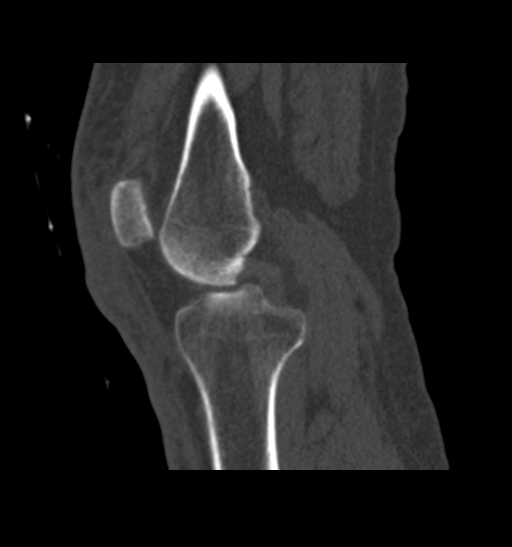
[im 71/106  bone]
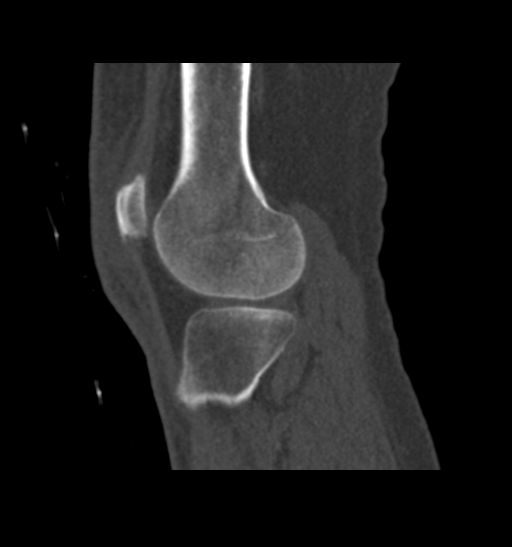
[im 88/106  bone]
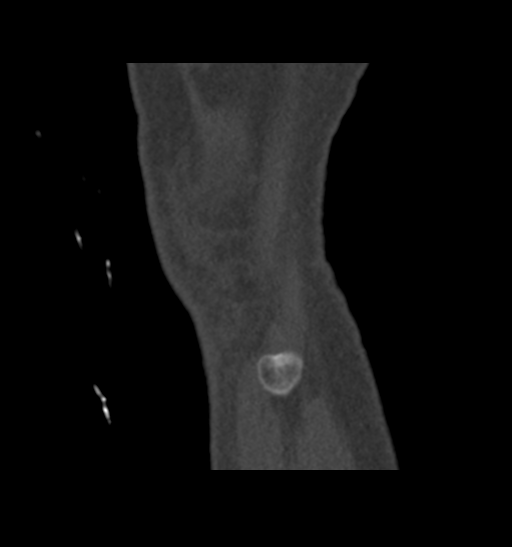

[16 of 33 positions shown; findings below may reference images not displayed]

FINDINGS: Moderate medial compartment degenerative changes with joint space
narrowing, early spurring, mild bony eburnation and subchondral
cystic change.

There are also mild degenerative changes at the patellofemoral joint
with subchondral cystic change accounting for the abnormality on the
x-ray. No patellar fracture is identified. No fractures of the
tibia, fibula or femur. No joint effusion.

Anterior soft tissue swelling consistent with contusion or hematoma.

Grossly the cruciate and collateral ligaments are intact and the
quadriceps and patellar tendons are intact.
IMPRESSION: 1. No acute fracture.
2. Mild tricompartmental degenerative changes, most significant in
the medial compartment.
3. Diffuse anterior soft tissue swelling/edema/hemorrhage consistent
with direct trauma.

## 2020-06-17 ENCOUNTER — Ambulatory Visit (INDEPENDENT_AMBULATORY_CARE_PROVIDER_SITE_OTHER): Payer: Medicare Other | Admitting: Otolaryngology

## 2020-06-17 ENCOUNTER — Other Ambulatory Visit: Payer: Self-pay

## 2020-06-17 ENCOUNTER — Encounter (INDEPENDENT_AMBULATORY_CARE_PROVIDER_SITE_OTHER): Payer: Self-pay | Admitting: Otolaryngology

## 2020-06-17 VITALS — Temp 95.9°F

## 2020-06-17 DIAGNOSIS — H9041 Sensorineural hearing loss, unilateral, right ear, with unrestricted hearing on the contralateral side: Secondary | ICD-10-CM | POA: Diagnosis not present

## 2020-06-17 NOTE — Progress Notes (Signed)
HPI: Brittney Moss is a 74 y.o. female who presents is referred by [facility] for evaluation of decreased hearing.  She presents today with her sisters who is noticed that she has had decreased hearing.  She complains of decreased hearing mostly in the right ear.  She has had this for about 4 months.  She has had no pain or drainage from the ear..  Past Medical History:  Diagnosis Date  . Blood clots in brain   . Depression   . Gait disorder   . 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 Hyperlipidemia   . Hypothyroidism   . Radius fracture    left wrist  . Short-term memory loss    post  cva  in 05/ 2000  . Stroke ([facility])   . Type 2 diabetes mellitus ([facility])    Past Surgical History:  Procedure Laterality Date  . BENIGN BREAST BX    . BREAST EXCISIONAL BIOPSY Right   . CHOLECYSTECTOMY  11/09/2011   Procedure: LAPAROSCOPIC CHOLECYSTECTOMY WITH INTRAOPERATIVE CHOLANGIOGRAM;  Surgeon: Velora Heckler, MD;  Location: [facility];  Service: General;  Laterality: N/A;  . OPEN REDUCTION INTERNAL FIXATION (ORIF) DISTAL RADIAL FRACTURE Left 04/29/2014   Procedure: OPEN REDUCTION INTERNAL FIXATION (ORIF) DISTAL RADIAL FRACTURE;  Surgeon: Sharma Covert, MD;  Location: [facility];  Service: Orthopedics;  Laterality: Left;  . subdrual hematoma  2000   as a result of the stroke   . SUBDURAL HEMATOMA EVACUATION VIA CRANIOTOMY  05/  2000  . TONSILLECTOMY    . TUBAL LIGATION    . WISDOM TOOTH EXTRACTION     Social History   Socioeconomic History  . Marital status: Widowed    Spouse name: Not on file  . Number of children: Not on file  . Years of education: Not on file  . Highest education level: Not on file  Occupational History  . Not on file  Tobacco Use  . Smoking status: Never Smoker  . Smokeless tobacco: Never Used   Substance and Sexual Activity  . Alcohol use: No  . Drug use: No  . Sexual activity: Not on file  Other Topics Concern  . Not on file  Social History Narrative   Patient is living in an assistant living.    Patient has a high school education.    Patient is a widow.    Patient does not work.    Social Determinants of Health   Financial Resource Strain:   . Difficulty of Paying Living Expenses:   Food Insecurity:   . Worried About Programme researcher, broadcasting/film/video in the Last Year:   . Barista in the Last Year:   Transportation Needs:   . Freight forwarder (Medical):   Marland Kitchen Lack of Transportation (Non-Medical):   Physical Activity:   . Days of Exercise per Week:   . Minutes of Exercise per Session:   Stress:   . Feeling of Stress :   Social Connections:   . Frequency of Communication with Friends and Family:   . Frequency of Social Gatherings with Friends and Family:   . Attends Religious Services:   . Active Member of Clubs or Organizations:   . Attends Banker Meetings:   Marland Kitchen Marital Status:    No family history on file.  No Known Allergies Prior to Admission medications   Medication Sig Start Date End Date Taking? Authorizing Provider  acetaminophen (TYLENOL) 500 MG tablet Take 500 mg by mouth every 6 (six) hours as needed for pain.   Yes [provider]  ASPERCREME LIDOCAINE EX Apply 1 application topically daily as needed (cervical joint disease). Aspercreme Lidocaine 4% Cream - Apply to back of neck   Yes [provider]  buPROPion (WELLBUTRIN SR) 150 MG 12 hr tablet Take 150 mg by mouth 2 (two) times daily.    Yes [provider]  Cholecalciferol 1000 UNITS tablet Take 2,000 Units by mouth daily.    Yes [provider]  clopidogrel (PLAVIX) 75 MG tablet Take 75 mg by mouth daily after breakfast.    Yes [provider]  CRESTOR 20 MG tablet Take 20 mg by mouth at bedtime.  01/05/14  Yes [provider]   enalapril (VASOTEC) 5 MG tablet Take 5 mg by mouth daily.  01/06/14  Yes [provider]  fenofibrate (TRICOR) 48 MG tablet Take 48 mg by mouth daily.  03/17/18  Yes [provider]  latanoprost (XALATAN) 0.005 % ophthalmic solution Place 1 drop into both eyes at bedtime. 12/19/18  Yes [provider]  levothyroxine (SYNTHROID, LEVOTHROID) 112 MCG tablet Take 112 mcg by mouth daily before breakfast.   Yes [provider]  loperamide (IMODIUM) 2 MG capsule Take 4 mg by mouth daily as needed for diarrhea or loose stools.  04/04/18  Yes [provider]  LORazepam (ATIVAN) 0.5 MG tablet Take 1 mg by mouth at bedtime as needed for sleep.    Yes [provider]  magnesium hydroxide (MILK OF MAGNESIA) 400 MG/5ML suspension Take 30 mLs by mouth daily as needed for mild constipation.   Yes [provider]  metFORMIN (GLUCOPHAGE) 1000 MG tablet Take 1,000 mg by mouth 2 (two) times daily with a meal.  03/28/18  Yes [provider]  metoprolol succinate (TOPROL-XL) 25 MG 24 hr tablet Take 25 mg by mouth daily.   Yes [provider]  Multiple Vitamins-Minerals (CERTA-VITE SENIOR-LUTEIN PO) Take 1 tablet by mouth daily.   Yes [provider]  OVER THE COUNTER MEDICATION Take 1 tablet by mouth daily. Viviscal tablet    Yes [provider]  Skin Protectants, Misc. (BAZA PROTECT EX) Apply 1 application topically 2 (two) times daily. Baza Protect 12%-1% - Clean and dry affected area and apply twice daily for 28 days   Yes [provider]     Positive ROS: Otherwise negative  All other systems have been reviewed and were otherwise negative with the exception of those mentioned in the HPI and as above.  Physical Exam: Constitutional: Alert, well-appearing, no acute distress Ears: External ears without lesions or tenderness. Ear canals are clear bilaterally.  TMs are clear bilaterally with no obvious middle ear  effusion noted. Nasal: External nose without lesions. Clear nasal passages Oral: Lips and gums without lesions. Tongue and palate mucosa without lesions. Posterior oropharynx clear. Neck: No palpable adenopathy or masses Respiratory: Breathing comfortably  Skin: No facial/neck lesions or rash noted.  Procedures  Audiogram demonstrated asymmetric right ear sensorineural hearing loss with right ear hearing pure tones ranging from 45-70 DB and left ear hearing ranging from 15 DB to 35 DB in the higher frequencies.  SRT's were 15 dB on the left and 45 dB on the right.  This was consistent with a sudden right ear SNHL.  Patient has a previous history of stroke and subarachnoid hemorrhage I suspect this is probably vascular in nature.  She had a CT scan of her head in November of last year that showed no intracranial abnormalities.  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  She is not interested in getting a hearing aid and states that she is functioning well presently.   Narda Bonds, MD   CC:

## 2020-06-18 ENCOUNTER — Encounter (INDEPENDENT_AMBULATORY_CARE_PROVIDER_SITE_OTHER): Payer: Self-pay

## 2020-12-28 ENCOUNTER — Other Ambulatory Visit: Payer: Self-pay | Admitting: Nurse Practitioner

## 2020-12-28 DIAGNOSIS — Z1231 Encounter for screening mammogram for malignant neoplasm of breast: Secondary | ICD-10-CM

## 2021-02-17 ENCOUNTER — Ambulatory Visit
Admission: RE | Admit: 2021-02-17 | Discharge: 2021-02-17 | Disposition: A | Discharge status: Home / Self Care | Payer: Medicare Other | Source: Ambulatory Visit | Attending: Nurse Practitioner | Admitting: Nurse Practitioner

## 2021-02-17 ENCOUNTER — Other Ambulatory Visit: Payer: Self-pay

## 2021-02-17 DIAGNOSIS — Z1231 Encounter for screening mammogram for malignant neoplasm of breast: Secondary | ICD-10-CM

## 2021-08-28 ENCOUNTER — Encounter (HOSPITAL_BASED_OUTPATIENT_CLINIC_OR_DEPARTMENT_OTHER): Payer: Self-pay | Admitting: Emergency Medicine

## 2021-08-28 ENCOUNTER — Emergency Department (HOSPITAL_BASED_OUTPATIENT_CLINIC_OR_DEPARTMENT_OTHER)
Admission: EM | Admit: 2021-08-28 | Discharge: 2021-08-28 | Disposition: A | Discharge status: Home / Self Care | Payer: Medicare Other | Attending: Emergency Medicine | Admitting: Emergency Medicine

## 2021-08-28 ENCOUNTER — Other Ambulatory Visit: Payer: Self-pay

## 2021-08-28 ENCOUNTER — Emergency Department (HOSPITAL_BASED_OUTPATIENT_CLINIC_OR_DEPARTMENT_OTHER): Payer: Medicare Other

## 2021-08-28 DIAGNOSIS — S0003XA Contusion of scalp, initial encounter: Secondary | ICD-10-CM | POA: Insufficient documentation

## 2021-08-28 DIAGNOSIS — Z8673 Personal history of transient ischemic attack (TIA), and cerebral infarction without residual deficits: Secondary | ICD-10-CM | POA: Diagnosis not present

## 2021-08-28 DIAGNOSIS — Y92019 Unspecified place in single-family (private) house as the place of occurrence of the external cause: Secondary | ICD-10-CM | POA: Insufficient documentation

## 2021-08-28 DIAGNOSIS — Z86718 Personal history of other venous thrombosis and embolism: Secondary | ICD-10-CM | POA: Insufficient documentation

## 2021-08-28 DIAGNOSIS — W19XXXA Unspecified fall, initial encounter: Secondary | ICD-10-CM | POA: Diagnosis not present

## 2021-08-28 DIAGNOSIS — Z7901 Long term (current) use of anticoagulants: Secondary | ICD-10-CM | POA: Diagnosis not present

## 2021-08-28 DIAGNOSIS — E039 Hypothyroidism, unspecified: Secondary | ICD-10-CM | POA: Insufficient documentation

## 2021-08-28 DIAGNOSIS — R519 Headache, unspecified: Secondary | ICD-10-CM | POA: Diagnosis present

## 2021-08-28 DIAGNOSIS — E119 Type 2 diabetes mellitus without complications: Secondary | ICD-10-CM | POA: Diagnosis not present

## 2021-08-28 DIAGNOSIS — Z7984 Long term (current) use of oral hypoglycemic drugs: Secondary | ICD-10-CM | POA: Diagnosis not present

## 2021-08-28 NOTE — ED Triage Notes (Signed)
Pt fell ,hit her head on the door frame and fell on floor. Pt has bump on back of her head,right side, no bleeding noted.

## 2021-08-28 NOTE — ED Provider Notes (Signed)
[facility] EMERGENCY DEPT Provider Note   CSN: [id number] Arrival date & time: 08/28/21  1037     History Chief Complaint  Patient presents with   Brittney Moss is a 75 y.o. female.  The history is provided by the patient.  Fall This is a new problem. The current episode started 3 to 5 hours ago. The problem has been resolved. Associated symptoms include headaches. Pertinent negatives include no chest pain, no abdominal pain and no shortness of breath. Nothing aggravates the symptoms. Nothing relieves the symptoms. She has tried nothing for the symptoms. The treatment provided no relief.      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  s/p  evacuation of hematoma   Hyperlipidemia    Hypothyroidism    Radius fracture    left wrist   Short-term memory loss    post  cva  in 05/ 2000   Stroke [facility])    Type 2 diabetes mellitus [facility])     Patient Active Problem List   Diagnosis Date Noted   Fracture of left distal radius 04/29/2014   Gait disorder 07/31/2013   Cognitive decline 07/31/2013   Cholelithiasis with cholecystitis 10/17/2011   RASH-NONVESICULAR 09/05/2007   CELLULITIS/ABSCESS, FACE 07/23/2007   HYPERLIPIDEMIA 07/02/2007   DEPRESSION 07/02/2007   HYPOTHYROIDISM 03/21/2007   CVA 03/21/2007   CRANIOTOMY, HX OF 03/21/2007   DILATION AND CURETTAGE, HX OF 03/21/2007    Past Surgical History:  Procedure Laterality Date   BENIGN BREAST BX     BREAST EXCISIONAL BIOPSY Right    CHOLECYSTECTOMY  11/09/2011   Procedure: LAPAROSCOPIC CHOLECYSTECTOMY WITH INTRAOPERATIVE CHOLANGIOGRAM;  Surgeon: Velora Heckler, MD;  Location: [facility];  Service: General;  Laterality: N/A;   OPEN REDUCTION INTERNAL FIXATION (ORIF) DISTAL RADIAL FRACTURE Left 04/29/2014    Procedure: OPEN REDUCTION INTERNAL FIXATION (ORIF) DISTAL RADIAL FRACTURE;  Surgeon: Sharma Covert, MD;  Location: [facility] [place];  Service: Orthopedics;  Laterality: Left;   subdrual hematoma  2000   as a result of the stroke    SUBDURAL HEMATOMA EVACUATION VIA CRANIOTOMY  05/  2000   TONSILLECTOMY     TUBAL LIGATION     WISDOM TOOTH EXTRACTION       OB History   No obstetric history on file.     No family history on file.  Social History   Tobacco Use   Smoking status: Never   Smokeless tobacco: Never  Substance Use Topics   Alcohol use: No   Drug use: No    Home Medications Prior to Admission medications   Medication Sig Start Date End Date Taking? Authorizing Provider  acetaminophen (TYLENOL) 500 MG tablet Take 500 mg by mouth every 6 (six) hours as needed for pain.    [provider]  ASPERCREME LIDOCAINE EX Apply 1 application topically daily as needed (cervical joint disease). Aspercreme Lidocaine 4% Cream - Apply to back of neck    [provider]  buPROPion (WELLBUTRIN SR) 150 MG 12 hr tablet Take 150 mg by mouth 2 (two) times daily.     [provider]  Cholecalciferol 1000 UNITS tablet Take 2,000 Units by mouth daily.     [provider]  clopidogrel (  PLAVIX) 75 MG tablet Take 75 mg by mouth daily after breakfast.     [provider]  CRESTOR 20 MG tablet Take 20 mg by mouth at bedtime.  01/05/14   [provider]  enalapril (VASOTEC) 5 MG tablet Take 5 mg by mouth daily.  01/06/14   [provider]  fenofibrate (TRICOR) 48 MG tablet Take 48 mg by mouth daily.  03/17/18   [provider]  latanoprost (XALATAN) 0.005 % ophthalmic solution Place 1 drop into both eyes at bedtime. 12/19/18   [provider]  levothyroxine (SYNTHROID, LEVOTHROID) 112 MCG tablet Take 112 mcg by mouth daily before breakfast.    [provider]  loperamide (IMODIUM) 2 MG capsule Take 4 mg by  mouth daily as needed for diarrhea or loose stools.  04/04/18   [provider]  LORazepam (ATIVAN) 0.5 MG tablet Take 1 mg by mouth at bedtime as needed for sleep.     [provider]  magnesium hydroxide (MILK OF MAGNESIA) 400 MG/5ML suspension Take 30 mLs by mouth daily as needed for mild constipation.    [provider]  metFORMIN (GLUCOPHAGE) 1000 MG tablet Take 1,000 mg by mouth 2 (two) times daily with a meal.  03/28/18   [provider]  metoprolol succinate (TOPROL-XL) 25 MG 24 hr tablet Take 25 mg by mouth daily.    [provider]  Multiple Vitamins-Minerals (CERTA-VITE SENIOR-LUTEIN PO) Take 1 tablet by mouth daily.    [provider]  OVER THE COUNTER MEDICATION Take 1 tablet by mouth daily. Viviscal tablet     [provider]  Skin Protectants, Misc. (BAZA PROTECT EX) Apply 1 application topically 2 (two) times daily. Baza Protect 12%-1% - Clean and dry affected area and apply twice daily for 28 days    [provider]    Allergies    Patient has no known allergies.  Review of Systems   Review of Systems  Constitutional:  Negative for chills and fever.  HENT:  Negative for ear pain and sore throat.   Eyes:  Negative for pain and visual disturbance.  Respiratory:  Negative for cough and shortness of breath.   Cardiovascular:  Negative for chest pain and palpitations.  Gastrointestinal:  Negative for abdominal pain and vomiting.  Genitourinary:  Negative for dysuria and hematuria.  Musculoskeletal:  Negative for arthralgias and back pain.  Skin:  Positive for wound. Negative for color change and rash.  Neurological:  Positive for headaches. Negative for seizures and syncope.  All other systems reviewed and are negative.  Physical Exam Updated Vital Signs BP 137/78   Temp 98 F (36.7 C) (Oral)   Resp 20   SpO2 100%   Physical Exam Vitals and nursing note reviewed.  Constitutional:      General: She  is not in acute distress.    Appearance: She is well-developed. She is not ill-appearing.  HENT:     Head:     Comments: Posterior scalp hematoma but no laceration    Nose: Nose normal.     Mouth/Throat:     Mouth: Mucous membranes are moist.  Eyes:     Extraocular Movements: Extraocular movements intact.     Conjunctiva/sclera: Conjunctivae normal.     Pupils: Pupils are equal, round, and reactive to light.  Cardiovascular:     Rate and Rhythm: Normal rate and regular rhythm.     Pulses: Normal pulses.     Heart sounds: Normal heart sounds.  No murmur heard. Pulmonary:     Effort: Pulmonary effort is normal. No respiratory distress.     Breath sounds: Normal breath sounds.  Abdominal:     Palpations: Abdomen is soft.     Tenderness: There is no abdominal tenderness.  Musculoskeletal:        General: No tenderness. Normal range of motion.     Cervical back: Normal range of motion and neck supple.     Comments: No midline spinal tenderness  Skin:    General: Skin is warm and dry.  Neurological:     General: No focal deficit present.     Mental Status: She is alert and oriented to person, place, and time.     Cranial Nerves: No cranial nerve deficit.     Sensory: No sensory deficit.     Motor: No weakness.     Coordination: Coordination normal.    ED Results / Procedures / Treatments   Labs (all labs ordered are listed, but only abnormal results are displayed) Labs Reviewed - No data to display  EKG None  Radiology CT Head Wo Contrast  Result Date: 08/28/2021 CLINICAL DATA:  A 75 year old female presents for evaluation of head trauma with scalp hematoma. Also on blood thinners. EXAM: CT HEAD WITHOUT CONTRAST TECHNIQUE: Contiguous axial images were obtained from the base of the skull through the vertex without intravenous contrast. COMPARISON:  Comparison made with November of 2020. FINDINGS: Brain: No evidence of acute infarction, hemorrhage, hydrocephalus, extra-axial  collection or mass lesion/mass effect. Signs of remote infarcts in the bilateral thalamus. Generalized atrophy and chronic microvascular ischemic change. Vascular: No hyperdense vessel or unexpected calcification. Skull: RIGHT posterior parietal scalp hematoma overlies 80 previous craniotomy site without acute process. Sinuses/Orbits: Chronic deformity of the RIGHT lamina papyracea is similar to previous imaging. No air-fluid levels in the visible sinuses. Orbits are unremarkable. Other: RIGHT posterior parietal scalp hematoma near the vertex. IMPRESSION: No acute intracranial abnormality. RIGHT posterior parietal scalp hematoma near the vertex without acute process. Signs of atrophy, remote infarcts and chronic microvascular ischemic change. Electronically Signed   By: Donzetta Kohut M.D.   On: 08/28/2021 11:17   CT Cervical Spine Wo Contrast  Result Date: 08/28/2021 CLINICAL DATA:  Patient hit head on door frame and fell onto floor. EXAM: CT CERVICAL SPINE WITHOUT CONTRAST TECHNIQUE: Multidetector CT imaging of the cervical spine was performed without intravenous contrast. Multiplanar CT image reconstructions were also generated. COMPARISON:  05/15/2018 FINDINGS: Alignment: Normal alignment. Skull base and vertebrae: The vertebral body heights are well preserved. Facet joints appear aligned. No signs of acute fracture. Soft tissues and spinal canal: No prevertebral fluid or swelling. No visible canal hematoma. Disc levels: Multilevel disc space narrowing and endplate spurring is noted at C4-5, C5-6 and C6-7. Upper chest: Negative. Other: None IMPRESSION: 1. No evidence for cervical spine fracture. 2. Cervical degenerative disc disease. Electronically Signed   By: Signa Kell M.D.   On: 08/28/2021 11:12    Procedures Procedures   Medications Ordered in ED Medications - No data to display  ED Course  I have reviewed the triage vital signs and the nursing notes.  Pertinent labs & imaging results  that were available during my care of the patient were reviewed by me and considered in my medical decision making (see chart for details).    MDM Rules/Calculators/A&P  JEORGIA HELMING is a 75 year old female who presents the ED after fall.  Normal vitals.  No fever.  Mechanical fall at home.  Hit the back of her head when she fell.  Has hematoma to her scalp.  Neurologically she is intact.  She is on Plavix for prior stroke.  No other extremity pain.  No midline spinal pain.  We will get a head and neck CT.  CT of head and neck are unremarkable.  Discharged in good condition.  Recommend ice.  This chart was dictated using voice recognition software.  Despite best efforts to proofread,  errors can occur which can change the documentation meaning.   Final Clinical Impression(s) / ED Diagnoses Final diagnoses:  Hematoma of scalp, initial encounter    Rx / DC Orders ED Discharge Orders     None        Virgina Norfolk, DO 08/28/21 1120

## 2021-08-28 NOTE — Discharge Instructions (Signed)
Recommend ice to your scalp.
